# Patient Record
Sex: Male | Born: 1955
Health system: Southern US, Community
[De-identification: ages and names within clinical notes are randomized; demographics above are authoritative.]

## PROBLEM LIST (undated history)

## (undated) DIAGNOSIS — E1165 Type 2 diabetes mellitus with hyperglycemia: Secondary | ICD-10-CM

## (undated) DIAGNOSIS — M545 Low back pain, unspecified: Secondary | ICD-10-CM

## (undated) DIAGNOSIS — M1990 Unspecified osteoarthritis, unspecified site: Secondary | ICD-10-CM

## (undated) DIAGNOSIS — R234 Changes in skin texture: Secondary | ICD-10-CM

## (undated) DIAGNOSIS — G8929 Other chronic pain: Secondary | ICD-10-CM

## (undated) DIAGNOSIS — Z5189 Encounter for other specified aftercare: Secondary | ICD-10-CM

## (undated) DIAGNOSIS — I10 Essential (primary) hypertension: Secondary | ICD-10-CM

## (undated) DIAGNOSIS — E119 Type 2 diabetes mellitus without complications: Secondary | ICD-10-CM

## (undated) DIAGNOSIS — G4700 Insomnia, unspecified: Secondary | ICD-10-CM

## (undated) DIAGNOSIS — R252 Cramp and spasm: Secondary | ICD-10-CM

## (undated) HISTORY — PX: FINGER SURGERY: SHX640

## (undated) HISTORY — DX: Cramp and spasm: R25.2

## (undated) HISTORY — DX: Encounter for other specified aftercare: Z51.89

## (undated) HISTORY — DX: Other chronic pain: G89.29

## (undated) HISTORY — DX: Low back pain: M54.5

## (undated) HISTORY — DX: Low back pain, unspecified: M54.50

## (undated) HISTORY — DX: Type 2 diabetes mellitus with hyperglycemia: E11.65

## (undated) HISTORY — DX: Type 2 diabetes mellitus without complications: E11.9

## (undated) HISTORY — DX: Unspecified osteoarthritis, unspecified site: M19.90

## (undated) HISTORY — PX: CHOLECYSTECTOMY: SHX55

## (undated) HISTORY — DX: Changes in skin texture: R23.4

## (undated) HISTORY — DX: Essential (primary) hypertension: I10

## (undated) HISTORY — PX: TOE AMPUTATION: SHX809

## (undated) HISTORY — PX: EYE MUSCLE SURGERY: SHX370

## (undated) HISTORY — DX: Insomnia, unspecified: G47.00

---

## 2005-08-01 ENCOUNTER — Encounter (INDEPENDENT_AMBULATORY_CARE_PROVIDER_SITE_OTHER): Payer: Self-pay | Admitting: Specialist

## 2005-08-01 ENCOUNTER — Ambulatory Visit (HOSPITAL_COMMUNITY): Admission: RE | Admit: 2005-08-01 | Discharge: 2005-08-01 | Payer: Self-pay | Admitting: Urology

## 2005-08-23 ENCOUNTER — Emergency Department (HOSPITAL_COMMUNITY): Admission: EM | Admit: 2005-08-23 | Discharge: 2005-08-23 | Payer: Self-pay | Admitting: Emergency Medicine

## 2010-01-08 ENCOUNTER — Emergency Department (HOSPITAL_COMMUNITY): Admission: EM | Admit: 2010-01-08 | Discharge: 2010-01-08 | Payer: Self-pay | Admitting: Emergency Medicine

## 2010-06-11 ENCOUNTER — Other Ambulatory Visit: Payer: Self-pay | Admitting: Emergency Medicine

## 2010-06-11 ENCOUNTER — Ambulatory Visit
Admission: RE | Admit: 2010-06-11 | Discharge: 2010-06-11 | Disposition: A | Discharge status: Home / Self Care | Payer: BC Managed Care – PPO | Source: Ambulatory Visit | Attending: Emergency Medicine | Admitting: Emergency Medicine

## 2010-06-11 DIAGNOSIS — R52 Pain, unspecified: Secondary | ICD-10-CM

## 2010-08-09 NOTE — H&P (Signed)
NAMEFLOYD, [name]             ACCOUNT NO.:  [id number]   MEDICAL RECORD NO.:  [id number]          PATIENT TYPE:  AMB   LOCATION:  DAY                           FACILITY:  [facility]   PHYSICIAN:  Ky Barban, M.D.DATE OF BIRTH:  1955-07-11   DATE OF ADMISSION:  DATE OF DISCHARGE:  LH                                HISTORY & PHYSICAL   CHIEF COMPLAINT:  Phimosis.   A 55 year old gentleman referred to me by Dr. Myna Hidalgo, who is having  problems with his foreskin.  It is tight and he said this is based on when  he has erection and sexual intercourse and wants to get circumcised.  No  other medical problems like diabetes or hypertension.   PAST HISTORY:  He had surgery on his left foot three times and also had  surgery on his right hand.  No history of hypertension or diabetes.   FAMILY HISTORY:  No history of prostate cancer.   MEDICATIONS:  Metformin 500 mg p.o. b.i.d.   REVIEW OF SYSTEMS:  Unremarkable.   PHYSICAL EXAMINATION:  VITAL SIGNS:  Blood pressure 110/70, temperature  97.8.  CENTRAL NERVOUS SYSTEM:  Negative.  HEAD AND NECK:  Negative.  CHEST:  Symmetrical.  HEART:  Regular sinus rhythm.  No murmur.  ABDOMEN:  Soft, flat.  The liver, spleen and kidneys are nonpalpable.  GENITAL:  Circumcised.  Meatus is very good.  Testes are normal.  RECTAL:  Normal sphincter tone.  No rectal mass.  Prostate 1+ and firm.   IMPRESSION:  1.  Phimosis.  2.  Diabetes.  3.  Hypertension.   PLAN:  Circumcision under anesthesia as an outpatient.  The procedure,  limitations and complications were discussed.      Ky Barban, M.D.  Electronically Signed     MIJ/MEDQ  D:  07/31/2005  T:  07/31/2005  Job:  [id number]   cc:   Rose Phi. Myna Hidalgo, M.D.  Fax: [phone]

## 2010-08-09 NOTE — Op Note (Signed)
[name], [name]             ACCOUNT NO.:  [id number]   MEDICAL RECORD NO.:  [id number]          PATIENT TYPE:  AMB   LOCATION:  DAY                           FACILITY:  [facility]   PHYSICIAN:  Ky Barban, M.D.DATE OF BIRTH:  12/25/1955   DATE OF PROCEDURE:  08/01/2005  DATE OF DISCHARGE:                                 OPERATIVE REPORT   PREOPERATIVE DIAGNOSIS:  Phimosis.   POSTOPERATIVE DIAGNOSIS:  Phimosis.   OPERATION PERFORMED:  Circumcision.   SURGEON:  Ky Barban, M.D.   ANESTHESIA:  General endotracheal.   DESCRIPTION OF PROCEDURE:  Patient is placed in supine position under  general endotracheal anesthesia.  After usual prep and drape, dorsal slit is  made. Glans penis was prepped with Betadine, redundant prepuce  circumferentially excised leaving about 5 mm mucosa.  Bleeders were ligated  and coagulated.  Complete hemostasis was obtained.  Then the circumference  was closed with interrupted sutures of 4-0 chromic.  Sterile gauze dressing  applied.  The base of the penis infiltrated with 0.25% Marcaine.  Patient  left the operating room in satisfactory condition.      Ky Barban, M.D.  Electronically Signed     MIJ/MEDQ  D:  08/01/2005  T:  08/02/2005  Job:  [id number]

## 2010-12-18 ENCOUNTER — Other Ambulatory Visit: Payer: Self-pay | Admitting: Emergency Medicine

## 2010-12-18 ENCOUNTER — Ambulatory Visit
Admission: RE | Admit: 2010-12-18 | Discharge: 2010-12-18 | Disposition: A | Discharge status: Home / Self Care | Payer: BC Managed Care – PPO | Source: Ambulatory Visit | Attending: Emergency Medicine | Admitting: Emergency Medicine

## 2010-12-18 DIAGNOSIS — R52 Pain, unspecified: Secondary | ICD-10-CM

## 2010-12-18 DIAGNOSIS — R200 Anesthesia of skin: Secondary | ICD-10-CM

## 2012-01-23 ENCOUNTER — Ambulatory Visit (INDEPENDENT_AMBULATORY_CARE_PROVIDER_SITE_OTHER): Payer: BC Managed Care – PPO | Admitting: Gastroenterology

## 2012-01-23 ENCOUNTER — Encounter: Payer: Self-pay | Admitting: Gastroenterology

## 2012-01-23 VITALS — BP 104/60 | HR 80 | Ht 71.75 in | Wt 233.4 lb

## 2012-01-23 DIAGNOSIS — R197 Diarrhea, unspecified: Secondary | ICD-10-CM

## 2012-01-23 DIAGNOSIS — R12 Heartburn: Secondary | ICD-10-CM

## 2012-01-23 MED ORDER — CHOLESTYRAMINE 4 G PO PACK: 1.0000 | PACK | Freq: Every day | ORAL | Status: DC

## 2012-01-23 NOTE — Progress Notes (Signed)
HPI: This is a    very pleasant 56 year old man whom I am meeting for the first time today.  Had colonoscopy at [facility]  Going on for a few years, underwent colonoscopy at [facility];  Also EGD These were at [facility].  Whenever he eats he has burning pains in chest. He has nausea as well.  He does not vomit very often.    90% of meals he has pyrosis.  Without imodium he would have 5-6 loose stools a day.  No bleeding.  Usually very loose stools.    Had GB removed 6-7 years ago, shortly before the loose stool. Has never tried anything else for bowels except imodium  Takes advil periodically.  About 3-4 times a week.  Drinks 4 beers a day.    Has tried tums for cramps.   No caffeine.   Smoke 1ppd.  Stable weight.     Review of systems: Pertinent positive and negative review of systems were noted in the above HPI section. Complete review of systems was performed and was otherwise normal.    Past Medical History  Diagnosis Date  . Diabetes   . Skin fissure   . Hypertension   . Insomnia   . Muscle cramps     Past Surgical History  Procedure Date  . Toe amputation     left  . Cholecystectomy   . Finger surgery     right hand  . Eye muscle surgery     bilateral    Current Outpatient Prescriptions  Medication Sig Dispense Refill  . glyBURIDE-metformin (GLUCOVANCE) 2.5-500 MG per tablet Take 1 tablet by mouth 2 (two) times daily with a meal.      . lisinopril-hydrochlorothiazide (PRINZIDE,ZESTORETIC) 20-12.5 MG per tablet Take 1 tablet by mouth daily.      . Multiple Vitamins-Minerals (ONE-A-DAY MENS HEALTH FORMULA PO) Take 1 tablet by mouth daily.      . Omega-3 Fatty Acids (FISH OIL PO) Take 1 tablet by mouth daily.        Allergies as of 01/23/2012  . (No Known Allergies)    Family History  Problem Relation Age of Onset  . Diabetes Sister   . Hypertension Sister     History   Social History  . Marital Status: Married    Spouse Name: N/A    Number of  Children: 3  . Years of Education: N/A   Occupational History  . 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 Smoking status: Current Every Day Smoker    Types: Cigarettes  . Smokeless tobacco: Never Used  . Alcohol Use: Yes     2 24oz cans of beer twice a day  . Drug Use: No  . Sexually Active: Not on file   Other Topics Concern  . Not on file   Social History Narrative  . No narrative on file       Physical Exam: Ht 5' 11.75" (1.822 m)  Wt 233 lb 6 oz (105.858 kg)  BMI 31.87 kg/m2 Constitutional: generally well-appearing Psychiatric: alert and oriented x3 Eyes: extraocular movements intact Mouth: oral pharynx moist, no lesions Neck: supple no lymphadenopathy Cardiovascular: heart regular rate and rhythm Lungs: clear to auscultation bilaterally Abdomen: soft, nontender, nondistended, no obvious ascites, no peritoneal signs, normal bowel sounds Extremities: no lower extremity edema bilaterally Skin: no lesions on visible extremities    Assessment and plan: 56 y.o. male with  pyrosis, chronic loose stools  We are going to get  previous records from [facility] sent here for review. In the meantime he is going to start once daily omeprazole as well as once daily cholestyramine for likely diarrhea related to his gallbladder surgery many years ago. He will call to report on his symptoms, response to medicines in 3 or 4 weeks.

## 2012-01-23 NOTE — Patient Instructions (Addendum)
One of your biggest health concerns is your smoking.  This increases your risk for most cancers and serious cardiovascular diseases such as strokes, heart attacks.  You should try your best to stop.  If you need assistance, please contact your PCP or Smoking Cessation Class at [facility] [phone]) or [facility] Quit-Line (1-800-QUIT-NOW). We will get records sent from your previous gastroenterologist for review.  This will include any endoscopic (colonoscopy or upper endoscopy) procedures and any associated pathology reports (done at [facility] 6-7 years ago, EGD, colonoscopy). Start omeprazole (OTC) one pill once daily, 20-30 min before breakfast meal daily. Start new medicine, cholestyramine After reviewing your [facility] records, may need to repeat some testing. Call to report on your symptoms, response to omeprazole and cholestyramine powder in 4 weeks.

## 2012-01-27 ENCOUNTER — Telehealth: Payer: Self-pay | Admitting: Gastroenterology

## 2012-01-27 NOTE — Telephone Encounter (Signed)
See the below phone note, I closed it before sending it.

## 2012-01-27 NOTE — Telephone Encounter (Signed)
EGD 2/ 2006 at [facility], [place] by Dr. Linna Darner; for abd pain, status post cholecystectomyh; findingins: "long hiatal hernia, non-oabstructive stricture...areas of acute gastritis, ulcer in the prepyloric area" biopsies taken for clo.  Unkown results of biopsies  Colonoscopy 01/2000 at [facility], [place], Dr. Linna Darner; done for Ct scan suggsting possible crohn's; findings "diverticulosis in left colon, mucosal erythema with  Small erosions in cecal area, unable to get into TI. Biopseis taken from cecum: "mild chronic colitis with interstitial congestion"  Patty, Let him know I reviewed his [facility] records.  He will need colonsocopy for screening and chronic diarrhea and EGD for GERD, history of ulcer in stomach.  LEC, moderate sedation, thanks

## 2012-01-28 NOTE — Telephone Encounter (Signed)
Unable to reach pt phone has been disconnected and letter has been mailed

## 2013-01-31 ENCOUNTER — Encounter (HOSPITAL_COMMUNITY): Payer: Self-pay | Admitting: Emergency Medicine

## 2013-01-31 ENCOUNTER — Emergency Department (HOSPITAL_COMMUNITY)
Admission: EM | Admit: 2013-01-31 | Discharge: 2013-01-31 | Disposition: A | Discharge status: Home / Self Care | Payer: Commercial Managed Care - PPO | Source: Home / Self Care | Attending: Emergency Medicine | Admitting: Emergency Medicine

## 2013-01-31 DIAGNOSIS — K612 Anorectal abscess: Secondary | ICD-10-CM

## 2013-01-31 DIAGNOSIS — K611 Rectal abscess: Secondary | ICD-10-CM

## 2013-01-31 MED ORDER — CIPROFLOXACIN HCL 500 MG PO TABS: 500.0000 mg | ORAL_TABLET | Freq: Two times a day (BID) | ORAL | Status: DC

## 2013-01-31 MED ORDER — METRONIDAZOLE 500 MG PO TABS: 500.0000 mg | ORAL_TABLET | Freq: Three times a day (TID) | ORAL | Status: DC

## 2013-01-31 MED ORDER — HYDROCODONE-ACETAMINOPHEN 5-325 MG PO TABS
2.0000 | ORAL_TABLET | Freq: Once | ORAL | Status: AC
Start: 2013-01-31 — End: 2013-01-31
  Administered 2013-01-31: 2 via ORAL

## 2013-01-31 MED ORDER — HYDROCODONE-ACETAMINOPHEN 5-325 MG PO TABS: ORAL_TABLET | ORAL | Status: DC

## 2013-01-31 MED ORDER — HYDROCODONE-ACETAMINOPHEN 5-325 MG PO TABS
ORAL_TABLET | ORAL | Status: AC
  Filled 2013-01-31: qty 2

## 2013-01-31 NOTE — ED Provider Notes (Signed)
Chief Complaint:   Chief Complaint  Patient presents with  . Tailbone Pain    History of Present Illness:    [name] is a 57 year old diabetic male who has had a four-day history of a painful bump in his rectal area just at the midline. This is draining some blood and pus. He denies any fever or chills. He's never had anything like this before. Denies diarrhea, constipation, blood in the stool, or abdominal pain.  Review of Systems:  Other than noted above, the patient denies any of the following symptoms: Systemic:  No fever, chills or sweats. Skin:  No rash or itching.  PMFSH:  Past medical history, family history, social history, meds, and allergies were reviewed.  No prior history of abscesses or MRSA. He has diabetes and hypertension. He takes Questran, Glucovance, lisinopril/hydrochlorothiazide, and omega-3 fatty acids.  Physical Exam:   Vital signs:  BP 160/93  Pulse 80  Temp(Src) 98.3 F (36.8 C) (Oral)  Resp 16  SpO2 98% Skin:  In the perianal area there is a tender, raised, fluctuant nodule just posterior to the anus. This appears to have drained a large amount of pus previously.  Skin exam was otherwise normal.  No rash. Ext:  Distal pulses were full, patient has full ROM of all joints.  Procedure:  Verbal informed consent was obtained.  The patient was informed of the risks and benefits of the procedure and understands and accepts.  Identity of the patient was verified verbally and by wristband.   The abscess area described above was prepped with Betadine and alcohol and anesthetized with 2 mL of 2% Xylocaine with epinephrine.  Using a #11 scalpel blade, a singe straight incision was made into the area of fluctulence, yielding no prurulent drainage.  Routine cultures were obtained.  Blunt dissection was used to break up loculations a gauze dressing was left in place, the patient may remove this and start warm sitz baths this afternoon.  A sterile pressure dressing was  applied.  Assessment:  The encounter diagnosis was Peri-rectal abscess.  My incision and drainage did not return any pus. I'm thinking that his story drained itself. He needs antibiotics and hot sitz baths. I did not obtain a culture, since there was nothing to culture.  Plan:   1.  Meds:  The following meds were prescribed:   New Prescriptions   CIPROFLOXACIN (CIPRO) 500 MG TABLET    Take 1 tablet (500 mg total) by mouth every 12 (twelve) hours.   HYDROCODONE-ACETAMINOPHEN (NORCO/VICODIN) 5-325 MG PER TABLET    1 to 2 tabs every 4 to 6 hours as needed for pain.   METRONIDAZOLE (FLAGYL) 500 MG TABLET    Take 1 tablet (500 mg total) by mouth 3 (three) times daily.    2.  Patient Education/Counseling:  The patient was given appropriate handouts, self care instructions, and instructed in symptomatic relief.  Suggested hot sitz baths twice a day.  3.  Follow up:  The patient was instructed to leave the dressing in place and return again in 48 hours for wound recheck, if becoming worse in any way, and given some red flag symptoms such as fever or worsening pain which would prompt immediate return.  Follow up here in 48 hours.     Reuben Likes, MD 01/31/13 [phone]

## 2013-01-31 NOTE — ED Notes (Signed)
C/o pain anal area ; declined this writers eval of area

## 2013-03-04 ENCOUNTER — Emergency Department (HOSPITAL_COMMUNITY)
Admission: EM | Admit: 2013-03-04 | Discharge: 2013-03-04 | Disposition: A | Discharge status: Home / Self Care | Payer: Commercial Managed Care - PPO | Source: Home / Self Care

## 2013-03-06 ENCOUNTER — Encounter (HOSPITAL_COMMUNITY): Payer: Self-pay | Admitting: Emergency Medicine

## 2013-03-06 ENCOUNTER — Emergency Department (INDEPENDENT_AMBULATORY_CARE_PROVIDER_SITE_OTHER)
Admission: EM | Admit: 2013-03-06 | Discharge: 2013-03-06 | Disposition: A | Discharge status: Home / Self Care | Payer: Commercial Managed Care - PPO | Source: Home / Self Care | Attending: Emergency Medicine | Admitting: Emergency Medicine

## 2013-03-06 DIAGNOSIS — K612 Anorectal abscess: Secondary | ICD-10-CM

## 2013-03-06 DIAGNOSIS — K610 Anal abscess: Secondary | ICD-10-CM

## 2013-03-06 MED ORDER — METRONIDAZOLE 500 MG PO TABS: 500.0000 mg | ORAL_TABLET | Freq: Three times a day (TID) | ORAL | Status: DC

## 2013-03-06 MED ORDER — CIPROFLOXACIN HCL 500 MG PO TABS: 500.0000 mg | ORAL_TABLET | Freq: Two times a day (BID) | ORAL | Status: DC

## 2013-03-06 NOTE — ED Notes (Signed)
C/o boil on buttocks States he was here three weeks ago for boil States he soaked in epsom salt States he has pain and drainage which is yellow and green Did take antibiotics

## 2013-03-06 NOTE — ED Provider Notes (Signed)
CSN: [id number]     Arrival date & time 03/06/13  6213 History   First MD Initiated Contact with Patient 03/06/13 1017     Chief Complaint  Patient presents with  . Recurrent Skin Infections   (Consider location/radiation/quality/duration/timing/severity/associated sxs/prior Treatment) HPI Comments: Pt seen here 11/10 for the same complaint. Area has continued to drain pus despite tx with cipro and flagyl.   Patient is a 57 y.o. male presenting with abscess. The history is provided by the patient.  Abscess Location:  Ano-genital Ano-genital abscess location:  Anus Abscess quality: draining   Duration:  5 weeks Progression:  Unchanged Chronicity:  Recurrent Context: diabetes   Relieved by:  Nothing Worsened by:  Nothing tried Ineffective treatments:  Oral antibiotics Associated symptoms: no fever     Past Medical History  Diagnosis Date  . Diabetes   . Skin fissure   . Hypertension   . Insomnia   . Muscle cramps    Past Surgical History  Procedure Laterality Date  . Toe amputation      left  . Cholecystectomy    . Finger surgery      right hand  . Eye muscle surgery      bilateral   Family History  Problem Relation Age of Onset  . Diabetes Sister   . Hypertension Sister    History  Substance Use Topics  . Smoking status: Current Every Day Smoker    Types: Cigarettes  . Smokeless tobacco: Never Used  . Alcohol Use: Yes     Comment: 2 24oz cans of beer twice a day    Review of Systems  Constitutional: Negative for fever and chills.  Skin:       Abscess in perianal region    Allergies  Review of patient's allergies indicates no known allergies.  Home Medications   Current Outpatient Rx  Name  Route  Sig  Dispense  Refill  . cholestyramine (QUESTRAN) 4 G packet   Oral   Take 1 packet by mouth daily.   60 each   12   . ciprofloxacin (CIPRO) 500 MG tablet   Oral   Take 1 tablet (500 mg total) by mouth every 12 (twelve) hours.   14 tablet   0   . glyBURIDE-metformin (GLUCOVANCE) 2.5-500 MG per tablet   Oral   Take 1 tablet by mouth 2 (two) times daily with a meal.         . HYDROcodone-acetaminophen (NORCO/VICODIN) 5-325 MG per tablet      1 to 2 tabs every 4 to 6 hours as needed for pain.   20 tablet   0   . lisinopril-hydrochlorothiazide (PRINZIDE,ZESTORETIC) 20-12.5 MG per tablet   Oral   Take 1 tablet by mouth daily.         . metroNIDAZOLE (FLAGYL) 500 MG tablet   Oral   Take 1 tablet (500 mg total) by mouth 3 (three) times daily.   21 tablet   0   . Multiple Vitamins-Minerals (ONE-A-DAY MENS HEALTH FORMULA PO)   Oral   Take 1 tablet by mouth daily.         . Omega-3 Fatty Acids (FISH OIL PO)   Oral   Take 1 tablet by mouth daily.          BP 152/76  Pulse 67  Temp(Src) 98.7 F (37.1 C) (Oral)  Resp 17  SpO2 100% Physical Exam  Constitutional: He appears well-developed and well-nourished. No distress.  Genitourinary:  ED Course  Procedures (including critical care time) Labs Review Labs Reviewed - No data to display Imaging Review No results found.  EKG Interpretation    Date/Time:    Ventricular Rate:    PR Interval:    QRS Duration:   QT Interval:    QTC Calculation:   R Axis:     Text Interpretation:              MDM   1. Perianal abscess   Dr. Lorenz Coaster saw pt with me, as he cared for pt 11/10. Abscess is in same area as previous. Most likely more internal abscess with fistula. Referred to general surgery. Rx cipro 500mg  BID #14 and flagyl 500mg  TID #21.      Cathlyn Parsons, NP 03/06/13 1031

## 2013-03-06 NOTE — ED Provider Notes (Signed)
Medical screening examination/treatment/procedure(s) were performed by non-physician practitioner and as supervising physician I was immediately available for consultation/collaboration.  Tahtiana Rozier, M.D.  Yoko Mcgahee C Kaileia Flow, MD 03/06/13 2057 

## 2013-03-07 ENCOUNTER — Encounter (INDEPENDENT_AMBULATORY_CARE_PROVIDER_SITE_OTHER): Payer: Self-pay | Admitting: Surgery

## 2013-03-07 ENCOUNTER — Telehealth (INDEPENDENT_AMBULATORY_CARE_PROVIDER_SITE_OTHER): Payer: Self-pay | Admitting: Surgery

## 2013-03-07 ENCOUNTER — Ambulatory Visit (INDEPENDENT_AMBULATORY_CARE_PROVIDER_SITE_OTHER): Payer: Commercial Managed Care - PPO | Admitting: Surgery

## 2013-03-07 ENCOUNTER — Encounter (INDEPENDENT_AMBULATORY_CARE_PROVIDER_SITE_OTHER): Payer: Self-pay

## 2013-03-07 VITALS — BP 132/78 | HR 82 | Temp 98.0°F | Resp 18 | Ht 74.0 in | Wt 226.0 lb

## 2013-03-07 DIAGNOSIS — K603 Anal fistula, unspecified: Secondary | ICD-10-CM

## 2013-03-07 NOTE — Telephone Encounter (Signed)
Patient met with surgery scheduling, went over financial responsibilities, will call back to schedule. °

## 2013-03-07 NOTE — Progress Notes (Signed)
Patient ID: [name], male   DOB: [birth year], 57 y.o.   MRN: [id number]  Chief Complaint  Patient presents with  . Follow-up    peri. rectal abcs.    HPI [name] is a 57 y.o. male.   HPIThis is a 57 year old gentleman sent from the emergency department for evaluation of a possible perianal fistula. He started having perianal drainage over a month ago. He has occasional mild discomfort. He describes his drainage was purulent. He has no issues regarding continence. He has had no previous anal surgery. He denies any previous anal fissure. He is otherwise without complaints  Past Medical History  Diagnosis Date  . Diabetes   . Skin fissure   . Hypertension   . Insomnia   . Muscle cramps     Past Surgical History  Procedure Laterality Date  . Toe amputation      left  . Cholecystectomy    . Finger surgery      right hand  . Eye muscle surgery      bilateral    Family History  Problem Relation Age of Onset  . Diabetes Sister   . Hypertension Sister     Social History History  Substance Use Topics  . Smoking status: Current Every Day Smoker    Types: Cigarettes  . Smokeless tobacco: Never Used  . Alcohol Use: Yes     Comment: 2 24oz cans of beer twice a day    No Known Allergies  Current Outpatient Prescriptions  Medication Sig Dispense Refill  . ciprofloxacin (CIPRO) 500 MG tablet Take 1 tablet (500 mg total) by mouth every 12 (twelve) hours.  14 tablet  0  . glyBURIDE-metformin (GLUCOVANCE) 2.5-500 MG per tablet Take 1 tablet by mouth 2 (two) times daily with a meal.      . lisinopril-hydrochlorothiazide (PRINZIDE,ZESTORETIC) 20-12.5 MG per tablet Take 1 tablet by mouth daily.      . metroNIDAZOLE (FLAGYL) 500 MG tablet Take 1 tablet (500 mg total) by mouth 3 (three) times daily.  21 tablet  0  . Multiple Vitamins-Minerals (ONE-A-DAY MENS HEALTH FORMULA PO) Take 1 tablet by mouth daily.      . Omega-3 Fatty Acids (FISH OIL PO) Take 1 tablet by mouth  daily.      . cholestyramine (QUESTRAN) 4 G packet Take 1 packet by mouth daily.  60 each  12  . HYDROcodone-acetaminophen (NORCO/VICODIN) 5-325 MG per tablet 1 to 2 tabs every 4 to 6 hours as needed for pain.  20 tablet  0   No current facility-administered medications for this visit.    Review of Systems Review of Systems  Constitutional: Negative for fever, chills and unexpected weight change.  HENT: Negative for congestion, hearing loss, sore throat, trouble swallowing and voice change.   Eyes: Negative for visual disturbance.  Respiratory: Negative for cough and wheezing.   Cardiovascular: Negative for chest pain, palpitations and leg swelling.  Gastrointestinal: Positive for rectal pain. Negative for nausea, vomiting, abdominal pain, diarrhea, constipation, blood in stool, abdominal distention and anal bleeding.  Genitourinary: Negative for hematuria and difficulty urinating.  Musculoskeletal: Negative for arthralgias.  Skin: Negative for rash and wound.  Neurological: Negative for seizures, syncope, weakness and headaches.  Hematological: Negative for adenopathy. Does not bruise/bleed easily.  Psychiatric/Behavioral: Negative for confusion.    Blood pressure 132/78, pulse 82, temperature 98 F (36.7 C), resp. rate 18, height 6\' 2"  (1.88 m), weight 226 lb (102.513 kg).  Physical Exam Physical Exam  Constitutional: He is oriented to person, place, and time. He appears well-nourished. No distress.  HENT:  Head: Normocephalic and atraumatic.  Right Ear: External ear normal.  Left Ear: External ear normal.  Nose: Nose normal.  Eyes: Conjunctivae are normal. Pupils are equal, round, and reactive to light.  Neck: Normal range of motion. Neck supple.  Cardiovascular: Normal rate, regular rhythm and normal heart sounds.   Pulmonary/Chest: Effort normal and breath sounds normal. No respiratory distress.  Genitourinary: Rectum normal.  There is a small opening just to the right of  the posterior midline in the perineum near the anus. There is no tenderness and only clear fluid draining. There is an old skin fold consistent with a possible previous fissure in the posterior midline  Neurological: He is alert and oriented to person, place, and time.  Skin: He is not diaphoretic.  Psychiatric: His behavior is normal.    Data Reviewed   Assessment    Perianal fistula     Plan    I explained the diagnosis to the patient and his wife. I would recommend fistulotomy under general anesthesia. The risks which include but are not limited to bleeding, infection, issues incontinence, injury to surround structures, having chronic drainage, recurrence, etc. He understands and wishes to proceed. Surgery will be scheduled        Toye Rouillard A 03/07/2013, 3:17 PM

## 2013-09-06 ENCOUNTER — Other Ambulatory Visit: Payer: Self-pay | Admitting: Otolaryngology

## 2013-09-06 DIAGNOSIS — H7190 Unspecified cholesteatoma, unspecified ear: Secondary | ICD-10-CM

## 2013-09-08 ENCOUNTER — Ambulatory Visit
Admission: RE | Admit: 2013-09-08 | Discharge: 2013-09-08 | Disposition: A | Discharge status: Home / Self Care | Payer: Commercial Managed Care - PPO | Source: Ambulatory Visit | Attending: Otolaryngology | Admitting: Otolaryngology

## 2013-09-08 DIAGNOSIS — H7190 Unspecified cholesteatoma, unspecified ear: Secondary | ICD-10-CM

## 2013-09-12 ENCOUNTER — Other Ambulatory Visit: Payer: Self-pay | Admitting: Otolaryngology

## 2013-10-07 ENCOUNTER — Other Ambulatory Visit: Payer: Self-pay | Admitting: Otolaryngology

## 2014-07-13 LAB — HM DIABETES EYE EXAM

## 2015-01-24 ENCOUNTER — Ambulatory Visit: Payer: Self-pay | Admitting: Family Medicine

## 2015-01-24 ENCOUNTER — Ambulatory Visit (INDEPENDENT_AMBULATORY_CARE_PROVIDER_SITE_OTHER): Payer: Managed Care, Other (non HMO) | Admitting: Family Medicine

## 2015-01-24 ENCOUNTER — Encounter: Payer: Self-pay | Admitting: Family Medicine

## 2015-01-24 VITALS — BP 118/76 | HR 68 | Ht 73.25 in | Wt 219.6 lb

## 2015-01-24 DIAGNOSIS — Z7689 Persons encountering health services in other specified circumstances: Secondary | ICD-10-CM

## 2015-01-24 DIAGNOSIS — Z720 Tobacco use: Secondary | ICD-10-CM | POA: Diagnosis not present

## 2015-01-24 DIAGNOSIS — J029 Acute pharyngitis, unspecified: Secondary | ICD-10-CM | POA: Diagnosis not present

## 2015-01-24 DIAGNOSIS — F1010 Alcohol abuse, uncomplicated: Secondary | ICD-10-CM | POA: Insufficient documentation

## 2015-01-24 DIAGNOSIS — E1165 Type 2 diabetes mellitus with hyperglycemia: Secondary | ICD-10-CM | POA: Diagnosis not present

## 2015-01-24 DIAGNOSIS — IMO0001 Reserved for inherently not codable concepts without codable children: Secondary | ICD-10-CM

## 2015-01-24 DIAGNOSIS — Z7189 Other specified counseling: Secondary | ICD-10-CM

## 2015-01-24 DIAGNOSIS — F17200 Nicotine dependence, unspecified, uncomplicated: Secondary | ICD-10-CM | POA: Insufficient documentation

## 2015-01-24 DIAGNOSIS — I10 Essential (primary) hypertension: Secondary | ICD-10-CM | POA: Diagnosis not present

## 2015-01-24 HISTORY — DX: Reserved for inherently not codable concepts without codable children: IMO0001

## 2015-01-24 LAB — POCT GLYCOSYLATED HEMOGLOBIN (HGB A1C): Hemoglobin A1C: 8.2

## 2015-01-24 MED ORDER — LISINOPRIL-HYDROCHLOROTHIAZIDE 10-12.5 MG PO TABS: 1.0000 | ORAL_TABLET | Freq: Every day | ORAL | Status: DC

## 2015-01-24 MED ORDER — METFORMIN HCL ER (MOD) 1000 MG PO TB24: 1000.0000 mg | ORAL_TABLET | Freq: Two times a day (BID) | ORAL | Status: DC

## 2015-01-24 MED ORDER — METFORMIN HCL 1000 MG PO TABS: 1000.0000 mg | ORAL_TABLET | Freq: Two times a day (BID) | ORAL | Status: DC

## 2015-01-24 NOTE — Progress Notes (Signed)
Subjective:    Patient ID: [name], male    DOB: 01-22-1956, 59 y.o.   MRN: [id number]  HPI Chief Complaint  Patient presents with  . new pt    new pt get established. would like a refill on meds- lisinopril-hctz, metformin. would like 3 month supply. sore throat 2-3 days   He is here to establish primary care. Most recent medical care was received at his previous employer [employer]. He states he had labs in May before he left and a physical exam. He has 2 medical conditions he would like addressed today and an acute complaint of sore throat for past 2 days. He denies fever, chills, headache, ear ache, congestion, sinus pain, drainage or cough. He has not taken anything for his sore throat. He does smoke. Was out of town last week and states he was exposed to a lot of people and does not know if anyone was sick. His wife is present for the visit.   HTN: states he was diagnosed more than 15 years ago and that he does not check his blood pressure at home.  Diabetes: states he was diagnosed about 15 years ago. States he does not check blood sugars at home. Report being compliant with twice daily Metformin.  Reports last physical exam: in May and everything normal. He does not know what his Hgb A1C was at that time.  Feet: 2 amputated toes from injury. Otherwise fine per patient.   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  He is not exercising. His diet is non-discretionary.  He reports he is drinking 2- 24 ounce beers daily and smoking 1 ppd. Not ready to stop drinking or smoking  Reviewed allergies, medications, past medical, surgical, family and social history. Immunizations - no records available Health maintenance: states he saw an eye doctor in May and told his eyes were good. Colonoscopy- states up to date  Review of Systems Pertinent positives and negatives in the history of present illness.    Objective:   Physical  Exam  Constitutional: He is oriented to person, place, and time. He appears well-developed and well-nourished. No distress.  HENT:  Right Ear: Tympanic membrane and ear canal normal.  Left Ear: Tympanic membrane and ear canal normal.  Nose: Nose normal.  Mouth/Throat: Uvula is midline and mucous membranes are normal. Posterior oropharyngeal erythema present. No oropharyngeal exudate or posterior oropharyngeal edema.  Lymphadenopathy:    He has no cervical adenopathy.       Right: No supraclavicular adenopathy present.       Left: No supraclavicular adenopathy present.  Neurological: He is alert and oriented to person, place, and time.  Psychiatric: He has a normal mood and affect. His speech is normal. Judgment and thought content normal. He is agitated.    Hemoglobin A1c 8.2    Assessment & Plan:  Uncontrolled type 2 diabetes mellitus without complication, without long-term current use of insulin (HCC) - Plan: DISCONTINUED: metFORMIN (GLUMETZA) 1000 MG (MOD) 24 hr tablet  Essential hypertension - Plan: lisinopril-hydrochlorothiazide (PRINZIDE,ZESTORETIC) 10-12.5 MG tablet, DISCONTINUED: lisinopril-hydrochlorothiazide (PRINZIDE,ZESTORETIC) 10-12.5 MG tablet  Acute pharyngitis, unspecified etiology  Encounter to establish care  Discussed that his sore throat is most likely due to a viral etiology and recommend symptomatic treatment. He will let me know if no improvement in 3-4 days.  Sabrina, CMA, called his pharmacy to confirm medications and they stated that he had not picked up medications  since February 2016. When patient was confronted about this he seems agitated and insisted that he had been taking medications.  Diabetes- discussed that records from the pharmacy shows that he is supposed to be taking Metformin and Glipezide or Glyburide. He denies ever taking these and states he does not want this medication refilled. Metformin refilled today. Discussed that his A1C is not in normal  range and that I recommend he start back on the Glipezide or Glyburide. He refuses to do this. I suggested that he start checking his blood sugars in the morning before breakfast at least two times per week and then 2 hours after a meal at least two times per week so we can get an idea of what his blood sugars are running at home. I then would like to bring him back and talk about the possibility of adding a second medication. Also discussed being aware of carbohydrates in his diet and starting to be more physically active.  Discussed reducing the amount of alcohol he is drinking and that this is not helping his diabetes or blood sugar. He seemed agitated and borderline inappropriate and did not want to discuss this.  HTN: refilled Lisinopril-HCTZ and no changes made. Blood pressure normal today.  Spent a minimum of 45 minutes face to face with patient and 50% of this was in counseling and coordination of care.  Need his records from [facility]

## 2015-01-24 NOTE — Patient Instructions (Addendum)
Cut back on alcohol intake. Check your blood sugars in morning before breakfast 2 times per week and 2 hours after a meal two times per week.  Use salt water gargles and throat lozenges for sore throat and let me know if it is not any better in a week.   Basic Carbohydrate Counting for Diabetes Mellitus Carbohydrate counting is a method for keeping track of the amount of carbohydrates you eat. Eating carbohydrates naturally increases the level of sugar (glucose) in your blood, so it is important for you to know the amount that is okay for you to have in every meal. Carbohydrate counting helps keep the level of glucose in your blood within normal limits. The amount of carbohydrates allowed is different for every person. A dietitian can help you calculate the amount that is right for you. Once you know the amount of carbohydrates you can have, you can count the carbohydrates in the foods you want to eat. Carbohydrates are found in the following foods:  Grains, such as breads and cereals.  Dried beans and soy products.  Starchy vegetables, such as potatoes, peas, and corn.  Fruit and fruit juices.  Milk and yogurt.  Sweets and snack foods, such as cake, cookies, candy, chips, soft drinks, and fruit drinks. CARBOHYDRATE COUNTING There are two ways to count the carbohydrates in your food. You can use either of the methods or a combination of both. Reading the "Nutrition Facts" on [place] The "Nutrition Facts" is an area that is included on the labels of almost all packaged food and beverages in the [place]. It includes the serving size of that food or beverage and information about the nutrients in each serving of the food, including the grams (g) of carbohydrate per serving.  Decide the number of servings of this food or beverage that you will be able to eat or drink. Multiply that number of servings by the number of grams of carbohydrate that is listed on the label for that serving.  The total will be the amount of carbohydrates you will be having when you eat or drink this food or beverage. Learning Standard Serving Sizes of Food When you eat food that is not packaged or does not include "Nutrition Facts" on the label, you need to measure the servings in order to count the amount of carbohydrates.A serving of most carbohydrate-rich foods contains about 15 g of carbohydrates. The following list includes serving sizes of carbohydrate-rich foods that provide 15 g ofcarbohydrate per serving:   1 slice of bread (1 oz) or 1 six-inch tortilla.    of a hamburger bun or English muffin.  4-6 crackers.   cup unsweetened dry cereal.    cup hot cereal.   cup rice or pasta.    cup mashed potatoes or  of a large baked potato.  1 cup fresh fruit or one small piece of fruit.    cup canned or frozen fruit or fruit juice.  1 cup milk.   cup plain fat-free yogurt or yogurt sweetened with artificial sweeteners.   cup cooked dried beans or starchy vegetable, such as peas, corn, or potatoes.  Decide the number of standard-size servings that you will eat. Multiply that number of servings by 15 (the grams of carbohydrates in that serving). For example, if you eat 2 cups of strawberries, you will have eaten 2 servings and 30 g of carbohydrates (2 servings x 15 g = 30 g). For foods such as soups and casseroles, in  which more than one food is mixed in, you will need to count the carbohydrates in each food that is included. EXAMPLE OF CARBOHYDRATE COUNTING Sample Dinner  3 oz chicken breast.   cup of brown rice.   cup of corn.  1 cup milk.   1 cup strawberries with sugar-free whipped topping.  Carbohydrate Calculation Step 1: Identify the foods that contain carbohydrates:   Rice.   Corn.   Milk.   Strawberries. Step 2:Calculate the number of servings eaten of each:   2 servings of rice.   1 serving of corn.   1 serving of milk.   1 serving  of strawberries. Step 3: Multiply each of those number of servings by 15 g:   2 servings of rice x 15 g = 30 g.   1 serving of corn x 15 g = 15 g.   1 serving of milk x 15 g = 15 g.   1 serving of strawberries x 15 g = 15 g. Step 4: Add together all of the amounts to find the total grams of carbohydrates eaten: 30 g + 15 g + 15 g + 15 g = 75 g.   This information is not intended to replace advice given to you by your health care provider. Make sure you discuss any questions you have with your health care provider.   Document Released: 03/10/2005 Document Revised: 03/31/2014 Document Reviewed: 02/04/2013 Elsevier Interactive Patient Education [employer].

## 2015-02-26 ENCOUNTER — Encounter: Payer: Self-pay | Admitting: Family Medicine

## 2015-02-26 ENCOUNTER — Ambulatory Visit (INDEPENDENT_AMBULATORY_CARE_PROVIDER_SITE_OTHER): Payer: Managed Care, Other (non HMO) | Admitting: Family Medicine

## 2015-02-26 VITALS — BP 120/72 | HR 64 | Temp 98.1°F | Wt 219.4 lb

## 2015-02-26 DIAGNOSIS — F17200 Nicotine dependence, unspecified, uncomplicated: Secondary | ICD-10-CM

## 2015-02-26 DIAGNOSIS — Z720 Tobacco use: Secondary | ICD-10-CM

## 2015-02-26 DIAGNOSIS — E1165 Type 2 diabetes mellitus with hyperglycemia: Secondary | ICD-10-CM

## 2015-02-26 DIAGNOSIS — J020 Streptococcal pharyngitis: Secondary | ICD-10-CM | POA: Diagnosis not present

## 2015-02-26 DIAGNOSIS — R5383 Other fatigue: Secondary | ICD-10-CM

## 2015-02-26 DIAGNOSIS — R509 Fever, unspecified: Secondary | ICD-10-CM

## 2015-02-26 DIAGNOSIS — F1010 Alcohol abuse, uncomplicated: Secondary | ICD-10-CM

## 2015-02-26 DIAGNOSIS — IMO0001 Reserved for inherently not codable concepts without codable children: Secondary | ICD-10-CM

## 2015-02-26 LAB — COMPREHENSIVE METABOLIC PANEL
ALBUMIN: 3.9 g/dL (ref 3.6–5.1)
ALK PHOS: 54 U/L (ref 40–115)
ALT: 45 U/L (ref 9–46)
AST: 38 U/L — ABNORMAL HIGH (ref 10–35)
BUN: 7 mg/dL (ref 7–25)
CHLORIDE: 98 mmol/L (ref 98–110)
CO2: 28 mmol/L (ref 20–31)
CREATININE: 0.9 mg/dL (ref 0.70–1.33)
Calcium: 8.9 mg/dL (ref 8.6–10.3)
Glucose, Bld: 192 mg/dL — ABNORMAL HIGH (ref 65–99)
Potassium: 4.5 mmol/L (ref 3.5–5.3)
Sodium: 136 mmol/L (ref 135–146)
TOTAL PROTEIN: 6.7 g/dL (ref 6.1–8.1)
Total Bilirubin: 0.3 mg/dL (ref 0.2–1.2)

## 2015-02-26 LAB — CBC WITH DIFFERENTIAL/PLATELET
BASOS ABS: 0 10*3/uL (ref 0.0–0.1)
Basophils Relative: 0 % (ref 0–1)
Eosinophils Absolute: 0.1 10*3/uL (ref 0.0–0.7)
Eosinophils Relative: 2 % (ref 0–5)
HCT: 43.2 % (ref 39.0–52.0)
HEMOGLOBIN: 14.3 g/dL (ref 13.0–17.0)
LYMPHS ABS: 2 10*3/uL (ref 0.7–4.0)
LYMPHS PCT: 35 % (ref 12–46)
MCH: 29.5 pg (ref 26.0–34.0)
MCHC: 33.1 g/dL (ref 30.0–36.0)
MCV: 89.3 fL (ref 78.0–100.0)
MONOS PCT: 13 % — AB (ref 3–12)
MPV: 9.4 fL (ref 8.6–12.4)
Monocytes Absolute: 0.8 10*3/uL (ref 0.1–1.0)
NEUTROS ABS: 2.9 10*3/uL (ref 1.7–7.7)
NEUTROS PCT: 50 % (ref 43–77)
Platelets: 253 10*3/uL (ref 150–400)
RBC: 4.84 MIL/uL (ref 4.22–5.81)
RDW: 12.9 % (ref 11.5–15.5)
WBC: 5.8 10*3/uL (ref 4.0–10.5)

## 2015-02-26 LAB — POCT MONO (EPSTEIN BARR VIRUS): MONO, POC: NEGATIVE

## 2015-02-26 LAB — TSH: TSH: 0.607 u[IU]/mL (ref 0.350–4.500)

## 2015-02-26 LAB — POCT RAPID STREP A (OFFICE): RAPID STREP A SCREEN: POSITIVE — AB

## 2015-02-26 MED ORDER — PENICILLIN G BENZATHINE & PROC 1200000 UNIT/2ML IM SUSP
1.2000 10*6.[IU] | Freq: Once | INTRAMUSCULAR | Status: AC
  Administered 2015-02-26: 1.2 10*6.[IU] via INTRAMUSCULAR

## 2015-02-26 MED ORDER — GLYBURIDE 1.25 MG PO TABS: 1.2500 mg | ORAL_TABLET | Freq: Every day | ORAL | Status: DC

## 2015-02-26 NOTE — Patient Instructions (Addendum)
You received an antibiotic injection (Penicillin) today for strep throat.  Glyburide 1.25 prescription sent to your pharmacy. Take this daily in the mornings. Continue checking her blood sugars and let me know if you have any lows (less than 80) and if your blood sugars are not consistently lower than 150 in the mornings and less than 180 two hours after eating.  Return for your diabetes follow-up appointment as scheduled. Strep Throat Strep throat is a bacterial infection of the throat. Your health care provider may call the infection tonsillitis or pharyngitis, depending on whether there is swelling in the tonsils or at the back of the throat. Strep throat is most common during the cold months of the year in children who are 32-54 years of age, but it can happen during any season in people of any age. This infection is spread from person to person (contagious) through coughing, sneezing, or close contact. CAUSES Strep throat is caused by the bacteria called Streptococcus pyogenes. RISK FACTORS This condition is more likely to develop in:  People who spend time in crowded places where the infection can spread easily.  People who have close contact with someone who has strep throat. SYMPTOMS Symptoms of this condition include:  Fever or chills.   Redness, swelling, or pain in the tonsils or throat.  Pain or difficulty when swallowing.  White or yellow spots on the tonsils or throat.  Swollen, tender glands in the neck or under the jaw.  Red rash all over the body (rare). DIAGNOSIS This condition is diagnosed by performing a rapid strep test or by taking a swab of your throat (throat culture test). Results from a rapid strep test are usually ready in a few minutes, but throat culture test results are available after one or two days. TREATMENT This condition is treated with antibiotic medicine. HOME CARE INSTRUCTIONS Medicines  Take over-the-counter and prescription medicines only as  told by your health care provider.  Take your antibiotic as told by your health care provider. Do not stop taking the antibiotic even if you start to feel better.  Have family members who also have a sore throat or fever tested for strep throat. They may need antibiotics if they have the strep infection. Eating and Drinking  Do not share food, drinking cups, or personal items that could cause the infection to spread to other people.  If swallowing is difficult, try eating soft foods until your sore throat feels better.  Drink enough fluid to keep your urine clear or pale yellow. General Instructions  Gargle with a salt-water mixture 3-4 times per day or as needed. To make a salt-water mixture, completely dissolve -1 tsp of salt in 1 cup of warm water.  Make sure that all household members wash their hands well.  Get plenty of rest.  Stay home from school or work until you have been taking antibiotics for 24 hours.  Keep all follow-up visits as told by your health care provider. This is important. SEEK MEDICAL CARE IF:  The glands in your neck continue to get bigger.  You develop a rash, cough, or earache.  You cough up a thick liquid that is green, yellow-brown, or bloody.  You have pain or discomfort that does not get better with medicine.  Your problems seem to be getting worse rather than better.  You have a fever. SEEK IMMEDIATE MEDICAL CARE IF:  You have new symptoms, such as vomiting, severe headache, stiff or painful neck, chest pain, or shortness  of breath.  You have severe throat pain, drooling, or changes in your voice.  You have swelling of the neck, or the skin on the neck becomes red and tender.  You have signs of dehydration, such as fatigue, dry mouth, and decreased urination.  You become increasingly sleepy, or you cannot wake up completely.  Your joints become red or painful.   This information is not intended to replace advice given to you by your  health care provider. Make sure you discuss any questions you have with your health care provider.   Document Released: 03/07/2000 Document Revised: 11/29/2014 Document Reviewed: 07/03/2014 Elsevier Interactive Patient Education [employer].

## 2015-02-26 NOTE — Addendum Note (Signed)
Addended by: Minette Headland A on: 02/26/2015 09:57 AM   Modules accepted: Orders

## 2015-02-26 NOTE — Progress Notes (Signed)
Subjective:    Patient ID: [name], male    DOB: [birth year], 57 y.o.   MRN: [id number]  HPI Chief Complaint  Patient presents with  . sore throat    sore throat, weakness- since last visit was here. white stuff in back of throat on friday. would like a black head looked at on back   He is here with complaints of sore throat for approximately one month that has gotten worse, states he . He also reports intermittent fever and sweating for past week. Body aches and decreased energy. Denies cough, drainage, congestion.  Blood sugar at home has been [phone] fasting and 140s-175-250s in evenings.  He is taking Metformin daily and Glyburide he takes if his blood sugar is greater than 200. He states this Glyburide 2.5mg  drops his sugar" too much" and he feels "wiped out".  He is requesting a lower dose of Glyburide. States he is having some numbness to the ball of his left foot, this is ongoing. He checks his feet daily and is careful about wearing shoes instead of going barefoot. Also reports some random, brief occasional episodes of "itching" to hand or foot.   He is still drinking beer daily, states he cut back to 1 beer. Smoking about a half a pack daily Started a new job and working days but switching to evenings.  States he has never been tested for HIV.   Reviewed allergies, medications, past medical and social history.  Review of Systems Pertinent positives and negatives in the history of present illness.    Objective:   Physical Exam BP 120/72 mmHg  Pulse 64  Temp(Src) 98.1 F (36.7 C) (Oral)  Wt 219 lb 6.4 oz (99.519 kg)  Alert and in no distress. No sinus tenderness Tympanic membranes with some scarring otherwise normal and canals are normal. Pharyngeal area is erythematous, with white patches, right side greater than left, no edema. Whitish buildup on tongue (patient reports unchanged for "years"). Neck is supple without adenopathy or thyromegaly. Cardiac exam shows a  regular sinus rhythm without murmurs or gallops. Lungs are clear to auscultation.   Rapid strep postive Mono spot negative    Assessment & Plan:  Acute streptococcal pharyngitis - Plan: POCT rapid strep A, penicillin g procaine-penicillin g benzathine (BICILLIN-CR) injection 600000-600000 units  Other fatigue - Plan: RPR, CBC with Differential/Platelet, Comprehensive metabolic panel, POCT Mono (Epstein Barr Virus), HIV antibody, TSH  Intermittent fever - Plan: RPR, CBC with Differential/Platelet, Comprehensive metabolic panel, POCT Mono (Epstein Barr Virus), HIV antibody  Uncontrolled type 2 diabetes mellitus without complication, without long-term current use of insulin ([place]) - Plan: glyBURIDE (DIABETA) 1.25 MG tablet  Smoker  Alcohol abuse, daily use  He was positive for strep pharyngitis, discussed options for treatment. Patient requested injection. Penicillin IM given. Patient observed in the office and no reaction.  Discussed consideration for thrush, however, patient states weight coating on his tongue has been present for years and is unchanged. We'll keep an eye on this. Discussed that prescription for glyburide 1.25 sent to pharmacy and that he should take this daily in the morning and continue checking his blood sugars. Reviewed his chart from last office visit and A1C was 8.2 at that time.  Also discussed that the fleeting episodes of itching to his hand or foot may be related to early development of peripheral neuropathy and that he will keep an eye on this and let me know if it gets worse.  He will let me know  if he has any lows or if he is continuing to have elevated blood sugars. Discussed that his fatigue may be related to strep and should resolve if this is the case. Recommend he stay well-hydrated and let me know if he is not feeling better. Will follow-up pending blood work. Recommend that he continue to cut back on his smoking and alcohol use, that this negatively affects  his blood sugars and overall health.

## 2015-02-27 LAB — RPR

## 2015-02-27 LAB — HIV ANTIBODY (ROUTINE TESTING W REFLEX): HIV: NONREACTIVE

## 2015-03-02 ENCOUNTER — Encounter: Payer: Self-pay | Admitting: Family Medicine

## 2015-03-30 ENCOUNTER — Encounter: Payer: Self-pay | Admitting: Family Medicine

## 2015-05-10 ENCOUNTER — Telehealth: Payer: Self-pay | Admitting: Family Medicine

## 2015-05-10 DIAGNOSIS — I10 Essential (primary) hypertension: Secondary | ICD-10-CM

## 2015-05-10 MED ORDER — LISINOPRIL-HYDROCHLOROTHIAZIDE 10-12.5 MG PO TABS: 1.0000 | ORAL_TABLET | Freq: Every day | ORAL | Status: DC

## 2015-05-10 NOTE — Telephone Encounter (Signed)
Pt come in and states that he needs a refill on his lisinopril pt uses [facility] [place], [place] - [address] and can be reached at [phone] (M) and please call and let him, know when it has been called in.

## 2015-05-10 NOTE — Telephone Encounter (Signed)
Sent med to pharmacy. And pt was notified

## 2015-05-21 ENCOUNTER — Encounter: Payer: Self-pay | Admitting: Family Medicine

## 2015-05-21 ENCOUNTER — Ambulatory Visit (INDEPENDENT_AMBULATORY_CARE_PROVIDER_SITE_OTHER): Payer: Managed Care, Other (non HMO) | Admitting: Family Medicine

## 2015-05-21 VITALS — BP 108/68 | HR 68 | Temp 98.9°F | Wt 208.2 lb

## 2015-05-21 DIAGNOSIS — E1165 Type 2 diabetes mellitus with hyperglycemia: Secondary | ICD-10-CM | POA: Diagnosis not present

## 2015-05-21 DIAGNOSIS — IMO0001 Reserved for inherently not codable concepts without codable children: Secondary | ICD-10-CM

## 2015-05-21 DIAGNOSIS — Z720 Tobacco use: Secondary | ICD-10-CM

## 2015-05-21 DIAGNOSIS — F1010 Alcohol abuse, uncomplicated: Secondary | ICD-10-CM

## 2015-05-21 DIAGNOSIS — F17200 Nicotine dependence, unspecified, uncomplicated: Secondary | ICD-10-CM

## 2015-05-21 DIAGNOSIS — J020 Streptococcal pharyngitis: Secondary | ICD-10-CM

## 2015-05-21 LAB — POCT RAPID STREP A (OFFICE): RAPID STREP A SCREEN: POSITIVE — AB

## 2015-05-21 MED ORDER — AMOXICILLIN 875 MG PO TABS: 875.0000 mg | ORAL_TABLET | Freq: Two times a day (BID) | ORAL | Status: DC

## 2015-05-21 NOTE — Progress Notes (Signed)
Subjective:  [name] is a 60 y.o. male who presents for evaluation of 6 day history of headache, fever, chills, body ache, sore throat.  He has not had a recent close exposure to someone with proven streptococcal pharyngitis.  States he is checking blood sugars occasionally and reading have been approximately 100 fasting.   Treatment to date: antihistamines.  ? sick contacts.  No other aggravating or relieving factors.  No other c/o.  Smoking less than 1 pack per day. Drinking alcohol daily still, at least one per day.   The following portions of the patient's history were reviewed and updated as appropriate: allergies, current medications, past medical history, past social history, past surgical history and problem list.  ROS as in subjective   Objective: Filed Vitals:   05/21/15 1442  BP: 108/68  Pulse: 68  Temp: 98.9 F (37.2 C)    General appearance: no distress, WD/WN, is mildly ill appearing. Diaphoretic. HEENT: normocephalic, conjunctiva/corneas normal, sclerae anicteric, nares patent, no discharge or erythema, pharynx with erythema, without exudate.  Oral cavity: MMM, no lesions  Neck: supple, no lymphadenopathy, no thyromegaly Heart: RRR, normal S1, S2, no murmurs Lungs: CTA bilaterally, no wheezes, rhonchi, or rales    Laboratory Strep test done. Results:positive.    Flu swab done: negative  Assessment and Plan: Acute streptococcal pharyngitis - Plan: amoxicillin (AMOXIL) 875 MG tablet  Uncontrolled type 2 diabetes mellitus without complication, without long-term current use of insulin (HCC)  Smoker  Alcohol abuse, daily use   Advised that symptoms and exam suggest a streptococcal etiology.  Prescription for amoxicillin sent to pharmacy. Discussed symptomatic treatment including salt water gargles, warm fluids, rest, hydrate well, can use over-the-counter Tylenol or Ibuprofen for throat pain, fever, or malaise. Recommend patient return in 12-14 days to  repeat rapid strep and to assess for treatment of cure since he was diagnosed and treated for strep throat 02/26/2015. Will also need to follow up on Diabetes and get another A1c. Recommend that he cut back on smoking and alcohol use. Patient is not ready to quit.

## 2015-05-21 NOTE — Patient Instructions (Addendum)
Take the amoxicillin twice daily with food for next 10 days and return in 12-14 days to have strep test.  Stay well hydrated.   Strep Throat Strep throat is a bacterial infection of the throat. Your health care provider may call the infection tonsillitis or pharyngitis, depending on whether there is swelling in the tonsils or at the back of the throat. Strep throat is most common during the cold months of the year in children who are 72-58 years of age, but it can happen during any season in people of any age. This infection is spread from person to person (contagious) through coughing, sneezing, or close contact. CAUSES Strep throat is caused by the bacteria called Streptococcus pyogenes. RISK FACTORS This condition is more likely to develop in:  People who spend time in crowded places where the infection can spread easily.  People who have close contact with someone who has strep throat. SYMPTOMS Symptoms of this condition include:  Fever or chills.   Redness, swelling, or pain in the tonsils or throat.  Pain or difficulty when swallowing.  White or yellow spots on the tonsils or throat.  Swollen, tender glands in the neck or under the jaw.  Red rash all over the body (rare). DIAGNOSIS This condition is diagnosed by performing a rapid strep test or by taking a swab of your throat (throat culture test). Results from a rapid strep test are usually ready in a few minutes, but throat culture test results are available after one or two days. TREATMENT This condition is treated with antibiotic medicine. HOME CARE INSTRUCTIONS Medicines  Take over-the-counter and prescription medicines only as told by your health care provider.  Take your antibiotic as told by your health care provider. Do not stop taking the antibiotic even if you start to feel better.  Have family members who also have a sore throat or fever tested for strep throat. They may need antibiotics if they have the strep  infection. Eating and Drinking  Do not share food, drinking cups, or personal items that could cause the infection to spread to other people.  If swallowing is difficult, try eating soft foods until your sore throat feels better.  Drink enough fluid to keep your urine clear or pale yellow. General Instructions  Gargle with a salt-water mixture 3-4 times per day or as needed. To make a salt-water mixture, completely dissolve -1 tsp of salt in 1 cup of warm water.  Make sure that all household members wash their hands well.  Get plenty of rest.  Stay home from school or work until you have been taking antibiotics for 24 hours.  Keep all follow-up visits as told by your health care provider. This is important. SEEK MEDICAL CARE IF:  The glands in your neck continue to get bigger.  You develop a rash, cough, or earache.  You cough up a thick liquid that is green, yellow-brown, or bloody.  You have pain or discomfort that does not get better with medicine.  Your problems seem to be getting worse rather than better.  You have a fever. SEEK IMMEDIATE MEDICAL CARE IF:  You have new symptoms, such as vomiting, severe headache, stiff or painful neck, chest pain, or shortness of breath.  You have severe throat pain, drooling, or changes in your voice.  You have swelling of the neck, or the skin on the neck becomes red and tender.  You have signs of dehydration, such as fatigue, dry mouth, and decreased urination.  You become increasingly sleepy, or you cannot wake up completely.  Your joints become red or painful.   This information is not intended to replace advice given to you by your health care provider. Make sure you discuss any questions you have with your health care provider.   Document Released: 03/07/2000 Document Revised: 11/29/2014 Document Reviewed: 07/03/2014 Elsevier Interactive Patient Education [employer].

## 2015-05-22 ENCOUNTER — Telehealth: Payer: Self-pay | Admitting: Family Medicine

## 2015-05-22 LAB — POC INFLUENZA A&B (BINAX/QUICKVUE)
Influenza A, POC: NEGATIVE
Influenza B, POC: NEGATIVE

## 2015-05-22 NOTE — Addendum Note (Signed)
Addended by: Minette Headland A on: 05/22/2015 10:34 AM   Modules accepted: Orders

## 2015-05-22 NOTE — Telephone Encounter (Signed)
Pt states head hurting bad, over left eye, especially when bends over or moves head, effecting sleep.  Asks that you call him back.

## 2015-05-22 NOTE — Telephone Encounter (Signed)
Pt notified of vickie's recommendations

## 2015-05-22 NOTE — Telephone Encounter (Signed)
Does he have any other symptoms such as fever, neck stiffness? Has he taken anything like tylenol or Ibuprofen for his headache? If not, I recommend that he take Ibuprofen 800 mg 3 times daily for next 2-3 days, his headache may be related to his current infection or he could also have some sinus issues causing this, the antibiotic should help if this is the case.  Make sure he is drinking plenty of water and try some caffeine also for headache treatment. Dehydration can cause headache.

## 2015-06-04 ENCOUNTER — Encounter: Payer: Self-pay | Admitting: Family Medicine

## 2015-06-04 ENCOUNTER — Ambulatory Visit (INDEPENDENT_AMBULATORY_CARE_PROVIDER_SITE_OTHER): Payer: Managed Care, Other (non HMO) | Admitting: Family Medicine

## 2015-06-04 VITALS — BP 128/80 | HR 64 | Wt 215.6 lb

## 2015-06-04 DIAGNOSIS — Z09 Encounter for follow-up examination after completed treatment for conditions other than malignant neoplasm: Secondary | ICD-10-CM

## 2015-06-04 LAB — POCT RAPID STREP A (OFFICE): RAPID STREP A SCREEN: NEGATIVE

## 2015-06-04 NOTE — Progress Notes (Signed)
   Subjective:    Patient ID: [name], male    DOB: 1955/06/06, 60 y.o.   MRN: [id number]  HPI Chief Complaint  Patient presents with  . follow-up    follow-up on strep. no issues   He is here for follow-up on strep pharyngitis. He had 2 positive strep swabs in December and again in February. He completed course of Amoxicillin and here for recheck to make sure this cleared up.  He has no complaints today.  Denies fever, chills, sweats, sore throat, nasal drainage or cough.    Review of Systems Pertinent positives and negatives in the history of present illness.     Objective:   Physical Exam BP 128/80 mmHg  Pulse 64  Wt 215 lb 9.6 oz (97.796 kg)  Alert and in no distress. Tympanic membranes and canals are normal. Pharyngeal area is normal. Neck is supple without adenopathy or thyromegaly.   Rapid strep swab: negative     Assessment & Plan:  Follow-up exam after treatment - Plan: POCT rapid strep A  Discussed that his strep has cleared. Will follow up for diabetes check in next few weeks.

## 2015-06-04 NOTE — Patient Instructions (Signed)
Return in the next 2-3 weeks for Diabetes follow up appointment.

## 2015-06-18 ENCOUNTER — Ambulatory Visit (INDEPENDENT_AMBULATORY_CARE_PROVIDER_SITE_OTHER): Payer: Managed Care, Other (non HMO) | Admitting: Family Medicine

## 2015-06-18 ENCOUNTER — Encounter: Payer: Self-pay | Admitting: Family Medicine

## 2015-06-18 VITALS — BP 110/70 | HR 64 | Wt 210.4 lb

## 2015-06-18 DIAGNOSIS — M25512 Pain in left shoulder: Secondary | ICD-10-CM

## 2015-06-18 DIAGNOSIS — IMO0001 Reserved for inherently not codable concepts without codable children: Secondary | ICD-10-CM

## 2015-06-18 DIAGNOSIS — K219 Gastro-esophageal reflux disease without esophagitis: Secondary | ICD-10-CM

## 2015-06-18 DIAGNOSIS — Z8739 Personal history of other diseases of the musculoskeletal system and connective tissue: Secondary | ICD-10-CM

## 2015-06-18 DIAGNOSIS — E1165 Type 2 diabetes mellitus with hyperglycemia: Secondary | ICD-10-CM | POA: Diagnosis not present

## 2015-06-18 LAB — POCT GLYCOSYLATED HEMOGLOBIN (HGB A1C)

## 2015-06-18 MED ORDER — ESOMEPRAZOLE MAGNESIUM 20 MG PO TBEC: 22.3000 mg | DELAYED_RELEASE_TABLET | Freq: Every day | ORAL | Status: DC

## 2015-06-18 MED ORDER — MICROLET LANCETS MISC: Status: AC

## 2015-06-18 NOTE — Patient Instructions (Addendum)
Schedule an appointment with Dr. Redmond School for possible left shoulder injection.  Take 1 Nexium 30 minutes before breakfast and avoid foods that upset your stomach. If the Nexium helps you can buy this over the counter.   Gastroesophageal Reflux Disease, Adult Normally, food travels down the esophagus and stays in the stomach to be digested. However, when a person has gastroesophageal reflux disease (GERD), food and stomach acid move back up into the esophagus. When this happens, the esophagus becomes sore and inflamed. Over time, GERD can create small holes (ulcers) in the lining of the esophagus.  CAUSES This condition is caused by a problem with the muscle between the esophagus and the stomach (lower esophageal sphincter, or LES). Normally, the LES muscle closes after food passes through the esophagus to the stomach. When the LES is weakened or abnormal, it does not close properly, and that allows food and stomach acid to go back up into the esophagus. The LES can be weakened by certain dietary substances, medicines, and medical conditions, including:  Tobacco use.  Pregnancy.  Having a hiatal hernia.  Heavy alcohol use.  Certain foods and beverages, such as coffee, chocolate, onions, and peppermint. RISK FACTORS This condition is more likely to develop in:  People who have an increased body weight.  People who have connective tissue disorders.  People who use NSAID medicines. SYMPTOMS Symptoms of this condition include:  Heartburn.  Difficult or painful swallowing.  The feeling of having a lump in the throat.  Abitter taste in the mouth.  Bad breath.  Having a large amount of saliva.  Having an upset or bloated stomach.  Belching.  Chest pain.  Shortness of breath or wheezing.  Ongoing (chronic) cough or a night-time cough.  Wearing away of tooth enamel.  Weight loss. Different conditions can cause chest pain. Make sure to see your health care provider if you  experience chest pain. DIAGNOSIS Your health care provider will take a medical history and perform a physical exam. To determine if you have mild or severe GERD, your health care provider may also monitor how you respond to treatment. You may also have other tests, including:  An endoscopy toexamine your stomach and esophagus with a small camera.  A test thatmeasures the acidity level in your esophagus.  A test thatmeasures how much pressure is on your esophagus.  A barium swallow or modified barium swallow to show the shape, size, and functioning of your esophagus. TREATMENT The goal of treatment is to help relieve your symptoms and to prevent complications. Treatment for this condition may vary depending on how severe your symptoms are. Your health care provider may recommend:  Changes to your diet.  Medicine.  Surgery. HOME CARE INSTRUCTIONS Diet  Follow a diet as recommended by your health care provider. This may involve avoiding foods and drinks such as:  Coffee and tea (with or without caffeine).  Drinks that containalcohol.  Energy drinks and sports drinks.  Carbonated drinks or sodas.  Chocolate and cocoa.  Peppermint and mint flavorings.  Garlic and onions.  Horseradish.  Spicy and acidic foods, including peppers, chili powder, curry powder, vinegar, hot sauces, and barbecue sauce.  Citrus fruit juices and citrus fruits, such as oranges, lemons, and limes.  Tomato-based foods, such as red sauce, chili, salsa, and pizza with red sauce.  Fried and fatty foods, such as donuts, french fries, potato chips, and high-fat dressings.  High-fat meats, such as hot dogs and fatty cuts of red and white meats,  such as rib eye steak, sausage, ham, and bacon.  High-fat dairy items, such as whole milk, butter, and cream cheese.  Eat small, frequent meals instead of large meals.  Avoid drinking large amounts of liquid with your meals.  Avoid eating meals during the  2-3 hours before bedtime.  Avoid lying down right after you eat.  Do not exercise right after you eat. General Instructions  Pay attention to any changes in your symptoms.  Take over-the-counter and prescription medicines only as told by your health care provider. Do not take aspirin, ibuprofen, or other NSAIDs unless your health care provider told you to do so.  Do not use any tobacco products, including cigarettes, chewing tobacco, and e-cigarettes. If you need help quitting, ask your health care provider.  Wear loose-fitting clothing. Do not wear anything tight around your waist that causes pressure on your abdomen.  Raise (elevate) the head of your bed 6 inches (15cm).  Try to reduce your stress, such as with yoga or meditation. If you need help reducing stress, ask your health care provider.  If you are overweight, reduce your weight to an amount that is healthy for you. Ask your health care provider for guidance about a safe weight loss goal.  Keep all follow-up visits as told by your health care provider. This is important. SEEK MEDICAL CARE IF:  You have new symptoms.  You have unexplained weight loss.  You have difficulty swallowing, or it hurts to swallow.  You have wheezing or a persistent cough.  Your symptoms do not improve with treatment.  You have a hoarse voice. SEEK IMMEDIATE MEDICAL CARE IF:  You have pain in your arms, neck, jaw, teeth, or back.  You feel sweaty, dizzy, or light-headed.  You have chest pain or shortness of breath.  You vomit and your vomit looks like blood or coffee grounds.  You faint.  Your stool is bloody or black.  You cannot swallow, drink, or eat.   This information is not intended to replace advice given to you by your health care provider. Make sure you discuss any questions you have with your health care provider.   Document Released: 12/18/2004 Document Revised: 11/29/2014 Document Reviewed: 07/05/2014 Elsevier  Interactive Patient Education [employer].

## 2015-06-18 NOTE — Progress Notes (Signed)
Subjective:    Patient ID: [name], male    DOB: Oct 24, 1955, 60 y.o.   MRN: [id number]  [name] is a 60 y.o. male who presents for follow-up of Type 2 diabetes mellitus. He also has 2 other complaints. Possible reflux and left shoulder pain.   Patient is checking home blood sugars about once per week.  Home blood sugar records: BGs range between 65 and 210\ How often is blood sugars being checked: once a week Current symptoms include: none. Patient denies none.  Patient is checking their feet daily. Any Foot concerns (callous, ulcer, wound, thickened nails, toenail fungus, skin fungus, hammer toe): none Last dilated eye exam: less than a year ago  Current treatments: Metformin and Glyburide.  Medication compliance: excellent  Current diet: in general, a "healthy" diet   Current exercise: work Known diabetic complications: none  Last eye exam last summer.   Also complains of left shoulder pain that is chronic is nature but has worsening flares occasionally with activities.  Pain is worse with movement, dull ache at rest. Takes Advil 2-3 tablets occasionally. Pain is non radiating, states pain is mostly anterior. States he has had XR in past and it showed arthritis in his shoulder.    He also reports that 3 days ago he ate some spicy chicken wings and then a couple of hours later he developed indigestion, belching, and ended up having 1 episode of vomiting. He did not take any medication for this. He denies history of reflux. Denies fever, chills nausea, vomiting, diarrhea, constipation, abdominal pain.  The following portions of the patient's history were reviewed and updated as appropriate: allergies, current medications, past medical history, past social history and problem list.  ROS as in subjective above.     Objective:    Physical Exam Alert and in no distress. Left shoulder without erythema, edema, nontender. No laxity, negative sulcus. Normal ROM, pain with  full flexion and lift off. Negative drop arm, neers and hawkins. Abdomen soft, nontender, normal bowel sounds.  Blood pressure 110/70, pulse 64, weight 210 lb 6.4 oz (95.437 kg).  Lab Review Diabetic Labs Latest Ref Rng 06/18/2015 02/26/2015 01/24/2015  HbA1c - 7.3% - 8.2%  Creatinine 0.70 - 1.33 mg/dL - 0.90 -   BP/Weight 06/18/2015 06/04/2015 05/21/2015 02/26/2015 123XX123  Systolic BP A999333 0000000 [id number]  Diastolic BP 70 80 68 72 76  Wt. (Lbs) 210.4 215.6 208.2 219.4 219.6  BMI 27.56 28.24 27.27 28.74 28.76   Foot/eye exam completion dates Latest Ref Rng 07/13/2014 07/13/2014  Eye Exam No Retinopathy No Retinopathy No Retinopathy  Foot Form Completion - - -    [name]  reports that he has been smoking Cigarettes.  He has been smoking about 1.00 pack per day. He has never used smokeless tobacco. He reports that he drinks alcohol. He reports that he does not use illicit drugs.     Assessment & Plan:    Uncontrolled type 2 diabetes mellitus without complication, without long-term current use of insulin ([place]) - Plan: POCT glycosylated hemoglobin (Hb A1C)  Gastroesophageal reflux disease, esophagitis presence not specified - Plan: Esomeprazole Magnesium (NEXIUM 24HR) 20 MG TBEC  History of arthritis  Pain in joint of left shoulder  1. Rx changes: none A1c today has improved and today is 7.3, last time was 8.2 2. Education: Reviewed 'ABCs' of diabetes management (respective goals in parentheses):  A1C (<7), blood pressure (<130/80), and cholesterol (LDL <100). 3. Compliance at present is estimated to be  excellent. Efforts to improve compliance (if necessary) will be directed at dietary modifications: low carbohyrate and sugar. . 4. Follow up: 4 months  5. Avoid foods that trigger GERD, written instructions provided. 6. Try nexium 30 minutes before breakfast, samples given.  7. Recommend that he cut back on daily alcohol intake and smoking. 8. Call for evaluation and possible injection to  the left shoulder by Dr. Redmond School.  9. Continue taking Advil for joint pain.

## 2015-06-29 ENCOUNTER — Ambulatory Visit
Admission: RE | Admit: 2015-06-29 | Discharge: 2015-06-29 | Disposition: A | Discharge status: Home / Self Care | Payer: 59 | Source: Ambulatory Visit | Attending: Family Medicine | Admitting: Family Medicine

## 2015-06-29 ENCOUNTER — Ambulatory Visit (INDEPENDENT_AMBULATORY_CARE_PROVIDER_SITE_OTHER): Payer: Managed Care, Other (non HMO) | Admitting: Family Medicine

## 2015-06-29 ENCOUNTER — Encounter: Payer: Self-pay | Admitting: Family Medicine

## 2015-06-29 VITALS — BP 130/70 | HR 74 | Wt 213.8 lb

## 2015-06-29 DIAGNOSIS — M25512 Pain in left shoulder: Secondary | ICD-10-CM | POA: Diagnosis not present

## 2015-06-29 MED ORDER — TRIAMCINOLONE ACETONIDE 40 MG/ML IJ SUSP
40.0000 mg | Freq: Once | INTRAMUSCULAR | Status: AC
  Administered 2015-06-29: 40 mg via INTRAMUSCULAR

## 2015-06-29 MED ORDER — LIDOCAINE HCL 2 % IJ SOLN
3.0000 mL | Freq: Once | INTRAMUSCULAR | Status: AC
  Administered 2015-06-29: 60 mg via INTRADERMAL

## 2015-06-29 NOTE — Progress Notes (Signed)
   Subjective:    Patient ID: [name], male    DOB: Apr 03, 1955, 60 y.o.   MRN: [id number]  HPI He is here for recheck. He was seen and evaluated for shoulder pain. He does work in this is interfering with his work. He has had no popping, locking.    Review of Systems     Objective:   Physical Exam Full range of motion of the shoulder. Abduction and internal as well as external rotation did cause a audible clicking was also palpable. Neer's and Hawkins test also causes same symptoms. No laxity noted. Negative drop arm test. X-ray shows no bony abnormalities.       Assessment & Plan:  Pain in joint of left shoulder - Plan: DG Shoulder Left, lidocaine (XYLOCAINE) 2 % (with pres) injection 60 mg, triamcinolone acetonide (KENALOG-40) injection 40 mg The results of the x-ray and exam were discussed with him in detail. I elected to give him 40 mg of Kenalog and 3 mL of Xylocaine into the subacromial bursa. This was done without difficulty. I explained that if he gets relief, we can repeat this in several months otherwise no relief with indicate need for physical therapy and eventually also MRI.

## 2015-07-31 ENCOUNTER — Encounter: Payer: Self-pay | Admitting: Family Medicine

## 2015-07-31 ENCOUNTER — Ambulatory Visit (INDEPENDENT_AMBULATORY_CARE_PROVIDER_SITE_OTHER): Payer: Managed Care, Other (non HMO) | Admitting: Family Medicine

## 2015-07-31 VITALS — BP 110/70 | HR 93 | Wt 210.0 lb

## 2015-07-31 DIAGNOSIS — K209 Esophagitis, unspecified without bleeding: Secondary | ICD-10-CM

## 2015-07-31 NOTE — Patient Instructions (Signed)
Take a tablespoon or 2 of either liquid Maalox or Mylanta every several hours.  No beer or any other alcohol for right now

## 2015-07-31 NOTE — Progress Notes (Signed)
   Subjective:    Patient ID: [name], male    DOB: [birth year], 60 y.o.   MRN: [id number]  HPI  he states that Saturday he was having difficulty with a dry cough and subsequently vomited and developed midsternal burning pain. He states that liquid or solids cause pain to get much worse. He did take one Nexium with no relief. He had a similar episode approximately 6 weeks ago and did take several days of Nexium with minimal relief of symptoms.   Review of Systems     Objective:   Physical Exam  alert and in no distress. Cardiac exam shows regular rhythm without murmurs or gallops. Lungs clear to auscultation. Abdominal exam shows active bowel sounds without masses or tenderness.       Assessment & Plan:  Acute esophagitis  I will try a different approach and have him use liquid Maalox or Mylanta regularly throughout the day. He will let me know next week how he is doing.

## 2015-10-03 ENCOUNTER — Ambulatory Visit (INDEPENDENT_AMBULATORY_CARE_PROVIDER_SITE_OTHER): Payer: Managed Care, Other (non HMO) | Admitting: Family Medicine

## 2015-10-03 ENCOUNTER — Encounter: Payer: Self-pay | Admitting: Family Medicine

## 2015-10-03 VITALS — BP 122/72 | HR 64 | Wt 216.8 lb

## 2015-10-03 DIAGNOSIS — E119 Type 2 diabetes mellitus without complications: Secondary | ICD-10-CM | POA: Diagnosis not present

## 2015-10-03 DIAGNOSIS — E1165 Type 2 diabetes mellitus with hyperglycemia: Secondary | ICD-10-CM

## 2015-10-03 DIAGNOSIS — F1010 Alcohol abuse, uncomplicated: Secondary | ICD-10-CM | POA: Diagnosis not present

## 2015-10-03 DIAGNOSIS — I10 Essential (primary) hypertension: Secondary | ICD-10-CM

## 2015-10-03 DIAGNOSIS — F17200 Nicotine dependence, unspecified, uncomplicated: Secondary | ICD-10-CM

## 2015-10-03 DIAGNOSIS — IMO0001 Reserved for inherently not codable concepts without codable children: Secondary | ICD-10-CM

## 2015-10-03 DIAGNOSIS — Z720 Tobacco use: Secondary | ICD-10-CM

## 2015-10-03 DIAGNOSIS — H7392 Unspecified disorder of tympanic membrane, left ear: Secondary | ICD-10-CM | POA: Diagnosis not present

## 2015-10-03 DIAGNOSIS — H9312 Tinnitus, left ear: Secondary | ICD-10-CM | POA: Insufficient documentation

## 2015-10-03 DIAGNOSIS — H9192 Unspecified hearing loss, left ear: Secondary | ICD-10-CM | POA: Insufficient documentation

## 2015-10-03 DIAGNOSIS — Z89422 Acquired absence of other left toe(s): Secondary | ICD-10-CM | POA: Diagnosis not present

## 2015-10-03 DIAGNOSIS — H7390 Unspecified disorder of tympanic membrane, unspecified ear: Secondary | ICD-10-CM | POA: Insufficient documentation

## 2015-10-03 DIAGNOSIS — Z89429 Acquired absence of other toe(s), unspecified side: Secondary | ICD-10-CM | POA: Insufficient documentation

## 2015-10-03 LAB — POCT GLYCOSYLATED HEMOGLOBIN (HGB A1C): Hemoglobin A1C: 6.4

## 2015-10-03 MED ORDER — LISINOPRIL-HYDROCHLOROTHIAZIDE 10-12.5 MG PO TABS: 1.0000 | ORAL_TABLET | Freq: Every day | ORAL | Status: DC

## 2015-10-03 MED ORDER — GLYBURIDE 1.25 MG PO TABS: 1.2500 mg | ORAL_TABLET | Freq: Every day | ORAL | Status: DC

## 2015-10-03 MED ORDER — METFORMIN HCL 1000 MG PO TABS: 1000.0000 mg | ORAL_TABLET | Freq: Every day | ORAL | Status: DC

## 2015-10-03 NOTE — Patient Instructions (Addendum)
Follow up with ENT for left ear.  I am going to refer you to podiatry to follow up on left foot.
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
## 2015-10-22 ENCOUNTER — Ambulatory Visit (INDEPENDENT_AMBULATORY_CARE_PROVIDER_SITE_OTHER): Payer: Managed Care, Other (non HMO) | Admitting: Podiatry

## 2015-10-22 ENCOUNTER — Encounter: Payer: Self-pay | Admitting: Podiatry

## 2015-10-22 VITALS — BP 139/72 | HR 84 | Resp 12

## 2015-10-22 DIAGNOSIS — E119 Type 2 diabetes mellitus without complications: Secondary | ICD-10-CM | POA: Diagnosis not present

## 2015-10-22 DIAGNOSIS — L84 Corns and callosities: Secondary | ICD-10-CM

## 2015-10-22 MED ORDER — UREA 40 % EX CREA: 1.0000 "application " | TOPICAL_CREAM | Freq: Every day | CUTANEOUS | 2 refills | Status: DC
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
## 2015-10-29 DIAGNOSIS — L84 Corns and callosities: Secondary | ICD-10-CM

## 2015-12-17 ENCOUNTER — Ambulatory Visit: Payer: Managed Care, Other (non HMO) | Admitting: Podiatry

## 2015-12-20 ENCOUNTER — Encounter: Payer: Self-pay | Admitting: Family Medicine

## 2015-12-20 ENCOUNTER — Ambulatory Visit (INDEPENDENT_AMBULATORY_CARE_PROVIDER_SITE_OTHER): Payer: Managed Care, Other (non HMO) | Admitting: Family Medicine

## 2015-12-20 VITALS — BP 140/70 | HR 62 | Temp 98.0°F | Wt 218.2 lb

## 2015-12-20 DIAGNOSIS — R109 Unspecified abdominal pain: Secondary | ICD-10-CM | POA: Diagnosis not present

## 2015-12-20 DIAGNOSIS — R81 Glycosuria: Secondary | ICD-10-CM | POA: Diagnosis not present

## 2015-12-20 LAB — POCT URINALYSIS DIPSTICK
Bilirubin, UA: NEGATIVE
Ketones, UA: NEGATIVE
Leukocytes, UA: NEGATIVE
NITRITE UA: NEGATIVE
PH UA: 6
PROTEIN UA: NEGATIVE
RBC UA: NEGATIVE
UROBILINOGEN UA: NEGATIVE

## 2015-12-20 LAB — GLUCOSE, POCT (MANUAL RESULT ENTRY): POC GLUCOSE: 199 mg/dL — AB (ref 70–99)

## 2015-12-20 NOTE — Patient Instructions (Signed)
Try heat to the area and take advil as needed. If pain gets worse let me know.

## 2015-12-20 NOTE — Progress Notes (Signed)
   Subjective:    Patient ID: [name], male    DOB: 01-21-1956, 60 y.o.   MRN: [id number]  HPI Chief Complaint  Patient presents with  . right side pain    right side pain- started monday morning and has eased up some.    He is here with complaints of right flank pain that started 4 days ago when he got out of bed. Denies injury.  Describes pain as a "pulling sensation". Pain is worse with movement. Non radiating. Pain is improved with rest.  Tried advil 2 tablets at home.  States pain is 70 % better than when it started.  Denies fever, chills, chest pain, palpitations, cough, shortness of breath, abdominal pain, nausea, vomiting, diarrhea, urinary frequency, urgency, hematuria.   States he is has been taking his daily medications. No increased thirst, urinary frequency. State he has been eating more cookies and sugars over the past 2 weeks.  States he had pancakes this morning.   He reports he still drinks 32 oz of beer daily and is still smoking. No other concerns or questions.   Reviewed allergies, medications, past medical,  and social history.   Review of Systems Pertinent positives and negatives in the history of present illness.     Objective:   Physical Exam  Constitutional: He is oriented to person, place, and time. He appears well-developed and well-nourished. No distress.  Neck: Normal range of motion. Neck supple.  Cardiovascular: Normal rate, regular rhythm and normal heart sounds.  Exam reveals no gallop and no friction rub.   No murmur heard. Pulmonary/Chest: Effort normal and breath sounds normal.  Abdominal: Soft. Bowel sounds are normal. He exhibits no distension. There is no tenderness. There is no rebound.  Musculoskeletal:       Thoracic back: Normal.       Lumbar back: Normal.       Back:  Pain with back extension, lateral bending, right sided lateral twisting. No erythema, rash, bruising, or deformity. Normal sensation. Back with normal ROM. Non  tender.   Neurological: He is alert and oriented to person, place, and time.  Skin: Skin is warm and dry. No rash noted. No erythema.  Psychiatric: He has a normal mood and affect. His behavior is normal. Judgment and thought content normal.   BP 140/70   Pulse 62   Temp 98 F (36.7 C) (Oral)   Wt 218 lb 3.2 oz (99 kg)   BMI 28.59 kg/m    Urinalysis dipstick: spec grav 1.030, glucose 3+, negative otherwise Finger stick random glucose: 199     Assessment & Plan:  Right flank pain - Plan: Urinalysis Dipstick  Glucosuria - Plan: Urinalysis Dipstick, POCT glucose (manual entry)  Discussed that his symptoms appear to be related to a musculoskeletal etiology and less likely a kidney stone. No systemic symptoms. He is 70% improved at this point and I recommend continuing conservative therapy and call/return if symptoms worsen.  Discussed that he is spilling glucose in his urine and advised to keep a closer eye on diet, he has been eating more sugar and carbohydrates lately. Glucose today is 199 two hours after eating pancakes. He reports that he is taking his daily diabetes medications.  He is scheduled for a CPE next month. Will check a hemoglobin A1C at that time.

## 2016-02-04 ENCOUNTER — Ambulatory Visit (INDEPENDENT_AMBULATORY_CARE_PROVIDER_SITE_OTHER): Payer: Managed Care, Other (non HMO) | Admitting: Family Medicine

## 2016-02-04 ENCOUNTER — Encounter: Payer: Self-pay | Admitting: Family Medicine

## 2016-02-04 ENCOUNTER — Encounter: Payer: Self-pay | Admitting: Gastroenterology

## 2016-02-04 VITALS — BP 122/80 | HR 64 | Ht 73.25 in | Wt 220.0 lb

## 2016-02-04 DIAGNOSIS — Z113 Encounter for screening for infections with a predominantly sexual mode of transmission: Secondary | ICD-10-CM

## 2016-02-04 DIAGNOSIS — M545 Low back pain, unspecified: Secondary | ICD-10-CM

## 2016-02-04 DIAGNOSIS — I10 Essential (primary) hypertension: Secondary | ICD-10-CM

## 2016-02-04 DIAGNOSIS — F17200 Nicotine dependence, unspecified, uncomplicated: Secondary | ICD-10-CM | POA: Diagnosis not present

## 2016-02-04 DIAGNOSIS — F1010 Alcohol abuse, uncomplicated: Secondary | ICD-10-CM | POA: Diagnosis not present

## 2016-02-04 DIAGNOSIS — Z125 Encounter for screening for malignant neoplasm of prostate: Secondary | ICD-10-CM

## 2016-02-04 DIAGNOSIS — E119 Type 2 diabetes mellitus without complications: Secondary | ICD-10-CM

## 2016-02-04 DIAGNOSIS — J069 Acute upper respiratory infection, unspecified: Secondary | ICD-10-CM

## 2016-02-04 DIAGNOSIS — G479 Sleep disorder, unspecified: Secondary | ICD-10-CM | POA: Diagnosis not present

## 2016-02-04 DIAGNOSIS — Z0000 Encounter for general adult medical examination without abnormal findings: Secondary | ICD-10-CM

## 2016-02-04 DIAGNOSIS — Z136 Encounter for screening for cardiovascular disorders: Secondary | ICD-10-CM | POA: Diagnosis not present

## 2016-02-04 DIAGNOSIS — Z1211 Encounter for screening for malignant neoplasm of colon: Secondary | ICD-10-CM | POA: Diagnosis not present

## 2016-02-04 LAB — POCT URINALYSIS DIPSTICK
Bilirubin, UA: NEGATIVE
Blood, UA: NEGATIVE
GLUCOSE UA: NEGATIVE
Ketones, UA: NEGATIVE
Leukocytes, UA: NEGATIVE
NITRITE UA: NEGATIVE
Protein, UA: NEGATIVE
Spec Grav, UA: 1.025
UROBILINOGEN UA: NEGATIVE
pH, UA: 6

## 2016-02-04 LAB — CBC WITH DIFFERENTIAL/PLATELET
BASOS PCT: 1 %
Basophils Absolute: 54 cells/uL (ref 0–200)
Eosinophils Absolute: 108 cells/uL (ref 15–500)
Eosinophils Relative: 2 %
HEMATOCRIT: 45.4 % (ref 38.5–50.0)
HEMOGLOBIN: 15.3 g/dL (ref 13.2–17.1)
LYMPHS ABS: 2754 {cells}/uL (ref 850–3900)
Lymphocytes Relative: 51 %
MCH: 30.6 pg (ref 27.0–33.0)
MCHC: 33.7 g/dL (ref 32.0–36.0)
MCV: 90.8 fL (ref 80.0–100.0)
MONO ABS: 432 {cells}/uL (ref 200–950)
MPV: 9.3 fL (ref 7.5–12.5)
Monocytes Relative: 8 %
NEUTROS ABS: 2052 {cells}/uL (ref 1500–7800)
Neutrophils Relative %: 38 %
Platelets: 274 10*3/uL (ref 140–400)
RBC: 5 MIL/uL (ref 4.20–5.80)
RDW: 13.3 % (ref 11.0–15.0)
WBC: 5.4 10*3/uL (ref 4.0–10.5)

## 2016-02-04 LAB — LIPID PANEL
CHOLESTEROL: 169 mg/dL (ref <200)
HDL: 66 mg/dL (ref >40)
LDL Cholesterol: 89 mg/dL (ref <100)
Total CHOL/HDL Ratio: 2.6 Ratio (ref <5.0)
Triglycerides: 71 mg/dL (ref <150)
VLDL: 14 mg/dL (ref <30)

## 2016-02-04 LAB — COMPLETE METABOLIC PANEL WITH GFR
ALBUMIN: 4.4 g/dL (ref 3.6–5.1)
ALT: 25 U/L (ref 9–46)
AST: 29 U/L (ref 10–35)
Alkaline Phosphatase: 51 U/L (ref 40–115)
BUN: 9 mg/dL (ref 7–25)
CALCIUM: 9.3 mg/dL (ref 8.6–10.3)
CHLORIDE: 101 mmol/L (ref 98–110)
CO2: 24 mmol/L (ref 20–31)
CREATININE: 0.92 mg/dL (ref 0.70–1.33)
GFR, Est African American: >89 mL/min (ref >=60)
GFR, Est Non African American: >89 mL/min (ref >=60)
GLUCOSE: 93 mg/dL (ref 65–99)
Potassium: 4.2 mmol/L (ref 3.5–5.3)
Sodium: 135 mmol/L (ref 135–146)
Total Bilirubin: 0.5 mg/dL (ref 0.2–1.2)
Total Protein: 7.3 g/dL (ref 6.1–8.1)

## 2016-02-04 LAB — TSH: TSH: 0.62 mIU/L (ref 0.40–4.50)

## 2016-02-04 LAB — PSA: PSA: 0.5 ng/mL (ref <=4.0)

## 2016-02-04 LAB — POCT GLYCOSYLATED HEMOGLOBIN (HGB A1C)

## 2016-02-04 NOTE — Patient Instructions (Addendum)
Try to stop smoking and cut back on daily alcohol use.  Your hemoglobin A1C today is 7.0% and is still at goal but at the upper limit so watch your sugar, carbs and starch intake.  Follow up in 6 months for Diabetes as long as your blood work is ok.  You will receive a call from the gastroenterologist office to schedule an appointment to discuss colonoscopy.   I recommend you go for eye exam and dental exam.   We will call you with lab results.   For your side pain, I recommend trying heat to it and taking advil or motrin or aleve and see if this helps.  You can also try the topical pain medication such as capcaisin or salon pas, these are all over the counter.  If it is not improving you can let me know.   For sleep, try not watching TV and making sure the room is cool, dark and no lights are on when you go to bed. If you are laying awake more than 30 minutes then get up and do something until you feel like you can go to sleep. Avoid taking naps and this will help you sleep longer. Also, avoid alcohol or caffeine at least 3 hours before you go to bed.  These things are all to improve your sleep hygiene.  Try melatonin 3 mg or 5 mg   Preventative Care for Adults, Male       REGULAR HEALTH EXAMS:  A routine yearly physical is a good way to check in with your primary care provider about your health and preventive screening. It is also an opportunity to share updates about your health and any concerns you have, and receive a thorough all-over exam.   Most health insurance companies pay for at least some preventative services.  Check with your health plan for specific coverages.  WHAT PREVENTATIVE SERVICES DO MEN NEED?  Adult men should have their weight and blood pressure checked regularly.   Men age 3 and older should have their cholesterol levels checked regularly.  Beginning at age 14 and continuing to age 102, men should be screened for colorectal cancer.  Certain people should may  need continued testing until age 32.  Other cancer screening may include exams for testicular and prostate cancer.  Updating vaccinations is part of preventative care.  Vaccinations help protect against diseases such as the flu.  Lab tests are generally done as part of preventative care to screen for anemia and blood disorders, to screen for problems with the kidneys and liver, to screen for bladder problems, to check blood sugar, and to check your cholesterol level.  Preventative services generally include counseling about diet, exercise, avoiding tobacco, drugs, excessive alcohol consumption, and sexually transmitted infections.    GENERAL RECOMMENDATIONS FOR GOOD HEALTH:  Healthy diet:  Eat a variety of foods, including fruit, vegetables, animal or vegetable protein, such as meat, fish, chicken, and eggs, or beans, lentils, tofu, and grains, such as rice.  Drink plenty of water daily.  Decrease saturated fat in the diet, avoid lots of red meat, processed foods, sweets, fast foods, and fried foods.  Exercise:  Aerobic exercise helps maintain good heart health. At least 30-40 minutes of moderate-intensity exercise is recommended. For example, a brisk walk that increases your heart rate and breathing. This should be done on most days of the week.   Find a type of exercise or a variety of exercises that you enjoy so that it becomes  a part of your daily life.  Examples are running, walking, swimming, water aerobics, and biking.  For motivation and support, explore group exercise such as aerobic class, spin class, Zumba, Yoga,or  martial arts, etc.    Set exercise goals for yourself, such as a certain weight goal, walk or run in a race such as a 5k walk/run.  Speak to your primary care provider about exercise goals.  Disease prevention:  If you smoke or chew tobacco, find out from your caregiver how to quit. It can literally save your life, no matter how long you have been a tobacco user.  If you do not use tobacco, never begin.   Maintain a healthy diet and normal weight. Increased weight leads to problems with blood pressure and diabetes.   The Body Mass Index or BMI is a way of measuring how much of your body is fat. Having a BMI above 27 increases the risk of heart disease, diabetes, hypertension, stroke and other problems related to obesity. Your caregiver can help determine your BMI and based on it develop an exercise and dietary program to help you achieve or maintain this important measurement at a healthful level.  High blood pressure causes heart and blood vessel problems.  Persistent high blood pressure should be treated with medicine if weight loss and exercise do not work.   Fat and cholesterol leaves deposits in your arteries that can block them. This causes heart disease and vessel disease elsewhere in your body.  If your cholesterol is found to be high, or if you have heart disease or certain other medical conditions, then you may need to have your cholesterol monitored frequently and be treated with medication.   Ask if you should have a stress test if your history suggests this. A stress test is a test done on a treadmill that looks for heart disease. This test can find disease prior to there being a problem.  Avoid drinking alcohol in excess (more than two drinks per day).  Avoid use of street drugs. Do not share needles with anyone. Ask for professional help if you need assistance or instructions on stopping the use of alcohol, cigarettes, and/or drugs.  Brush your teeth twice a day with fluoride toothpaste, and floss once a day. Good oral hygiene prevents tooth decay and gum disease. The problems can be painful, unattractive, and can cause other health problems. Visit your dentist for a routine oral and dental check up and preventive care every 6-12 months.   Look at your skin regularly.  Use a mirror to look at your back. Notify your caregivers of changes in  moles, especially if there are changes in shapes, colors, a size larger than a pencil eraser, an irregular border, or development of new moles.  Safety:  Use seatbelts 100% of the time, whether driving or as a passenger.  Use safety devices such as hearing protection if you work in environments with loud noise or significant background noise.  Use safety glasses when doing any work that could send debris in to the eyes.  Use a helmet if you ride a bike or motorcycle.  Use appropriate safety gear for contact sports.  Talk to your caregiver about gun safety.  Use sunscreen with a SPF (or skin protection factor) of 15 or greater.  Lighter skinned people are at a greater risk of skin cancer. Don't forget to also wear sunglasses in order to protect your eyes from too much damaging sunlight. Damaging sunlight can accelerate  cataract formation.   Practice safe sex. Use condoms. Condoms are used for birth control and to help reduce the spread of sexually transmitted infections (or STIs).  Some of the STIs are gonorrhea (the clap), chlamydia, syphilis, trichomonas, herpes, HPV (human papilloma virus) and HIV (human immunodeficiency virus) which causes AIDS. The herpes, HIV and HPV are viral illnesses that have no cure. These can result in disability, cancer and death.   Keep carbon monoxide and smoke detectors in your home functioning at all times. Change the batteries every 6 months or use a model that plugs into the wall.   Vaccinations:  Stay up to date with your tetanus shots and other required immunizations. You should have a booster for tetanus every 10 years. Be sure to get your flu shot every year, since 5%-20% of the [place] population comes down with the flu. The flu vaccine changes each year, so being vaccinated once is not enough. Get your shot in the fall, before the flu season peaks.   Other vaccines to consider:  Pneumococcal vaccine to protect against certain types of pneumonia.  This is  normally recommended for adults age 74 or older.  However, adults younger than 58 years old with certain underlying conditions such as diabetes, heart or lung disease should also receive the vaccine.  Shingles vaccine to protect against Varicella Zoster if you are older than age 71, or younger than 58 years old with certain underlying illness.  Hepatitis A vaccine to protect against a form of infection of the liver by a virus acquired from food.  Hepatitis B vaccine to protect against a form of infection of the liver by a virus acquired from blood or body fluids, particularly if you work in health care.  If you plan to travel internationally, check with your local health department for specific vaccination recommendations.  Cancer Screening:  Most routine colon cancer screening begins at the age of 42. On a yearly basis, doctors may provide special easy to use take-home tests to check for hidden blood in the stool. Sigmoidoscopy or colonoscopy can detect the earliest forms of colon cancer and is life saving. These tests use a small camera at the end of a tube to directly examine the colon. Speak to your caregiver about this at age 62, when routine screening begins (and is repeated every 5 years unless early forms of pre-cancerous polyps or small growths are found).   At the age of 34 men usually start screening for prostate cancer every year. Screening may begin at a younger age for those with higher risk. Those at higher risk include African-Americans or having a family history of prostate cancer. There are two types of tests for prostate cancer:   Prostate-specific antigen (PSA) testing. Recent studies raise questions about prostate cancer using PSA and you should discuss this with your caregiver.   Digital rectal exam (in which your doctor's lubricated and gloved finger feels for enlargement of the prostate through the anus).   Screening for testicular cancer.  Do a monthly exam of your  testicles. Gently roll each testicle between your thumb and fingers, feeling for any abnormal lumps. The best time to do this is after a hot shower or bath when the tissues are looser. Notify your caregivers of any lumps, tenderness or changes in size or shape immediately.

## 2016-02-04 NOTE — Progress Notes (Signed)
Subjective:    Patient ID: [name], male    DOB: [birth year], 58 y.o.   MRN: [id number]  HPI Chief Complaint  Patient presents with  . fasting cpe    fasting cpe. med check. right side is still hurting. declines flu shot today   He is here for a complete physical exam. Complains of a 2 week history of acute URI symptoms that include sinus pressure,  headache, rhinorrhea. States this has been improving.  No ear pain, sore throat, PND, cough.   Right flank pain that has been ongoing for approximately a few months. Pain is intermittent with certain movements. Non radiating. Feels like an ache, dull mainly. He does not always have pain, in fact, he states he did not have pain for several weeks but then he lifted something at work and thinks it flared up.  Not taking anything for the pain. Is not using heat or ice. Pain is not keeping him from his daily activities.   Complains of sleep issues that has been ongoing for years. States he snores but does not stop breathing. States he typically goes to sleep fine and sleeps about 2-3 hours but he then wakes up and has a tough time going back to sleep so he watches TV. Has a TV in his bedroom. Lays in bed awake for hours until he gets up and eats breakfast and then he sleeps for another 2-3 hours.  Drinks a beer before bed.  Works 3:30 pm until midnight. Has been doing this for years. He has tried medication in the past but does not like to feel "drugged".   Diabetes- Is not checking blood sugars. Denies issues with medications. No concerns about this today.  HTN- takes daily medication. Does not check his BP at home.   Denies cardiac history and no known family history of cardiac disease.   Last CPE: 2 years ago  Other providers: none  He is still smoking approximately one pack per day, drinks alcohol daily, denies drug use Diet: states he is eating 2 times per day, not snacking.  Exercise: nothing outside of work.   He is sexually  active and would like STD testing.   Immunizations: flu shot- refuses. Tetanus- thinks this was in past 5 years. Pneumonia shot 2 years ago at [employer] former employer.   Health maintenance:  Colonoscopy: had one in his 58s.  Last PSA: never Last Dental Exam: years.  Last Eye Exam: 2 years  Wears seatbelt always, uses sunscreen, smoke detectors in home and functioning, does not text while driving, feels safe in home environment.  Reviewed allergies, medications, past medical, surgical, family, and social history.   Review of Systems Review of Systems Constitutional: -fever, -chills, -sweats, -unexpected weight change,-fatigue ENT: -runny nose, -ear pain, -sore throat Cardiology:  -chest pain, -palpitations, -edema Respiratory: -cough, -shortness of breath, -wheezing Gastroenterology: -abdominal pain, -nausea, -vomiting, -diarrhea, -constipation  Hematology: -bleeding or bruising problems Musculoskeletal: -arthralgias, -myalgias, -joint swelling, + right low back pain Ophthalmology: -vision changes Urology: -dysuria, -difficulty urinating, -hematuria, -urinary frequency, -urgency Neurology: -headache, -weakness, -tingling, -numbness       Objective:   Physical Exam BP 122/80   Pulse 64   Ht 6' 1.25" (1.861 m)   Wt 220 lb (99.8 kg)   BMI 28.83 kg/m   General Appearance:    Alert, cooperative, no distress, appears stated age  Head:    Normocephalic, without obvious abnormality, atraumatic  Eyes:    PERRL, conjunctiva/corneas clear, EOM's intact,  fundi    benign  Ears:    Normal TM's and external ear canals  Nose:   Nares normal, mucosa normal, no drainage or sinus   tenderness  Throat:   Lips, mucosa, and tongue normal; teeth and gums normal  Neck:   Supple, no lymphadenopathy;  thyroid:  no   enlargement/tenderness/nodules; no carotid   bruit or JVD  Back:    Spine nontender, no curvature, ROM normal, no CVA     tenderness  Lungs:     Clear to auscultation bilaterally  without wheezes, rales or     ronchi; respirations unlabored  Chest Wall:    No tenderness or deformity   Heart:    Regular rate and rhythm, S1 and S2 normal, no murmur, rub   or gallop  Breast Exam:    No chest wall tenderness, masses or gynecomastia  Abdomen:     Soft, non-tender, nondistended, normoactive bowel sounds,    no masses, no hepatosplenomegaly  Genitalia:    Refused.   Rectal:    Refused.  Extremities:   No clubbing, cyanosis or edema  Pulses:   2+ and symmetric all extremities  Skin:   Skin color, texture, turgor normal, no rashes or lesions  Lymph nodes:   Cervical, supraclavicular, and axillary nodes normal  Neurologic:   CNII-XII intact, normal strength, sensation and gait; reflexes 2+ and symmetric throughout          Psych:   Normal mood, affect, hygiene and grooming.     Urinalysis dipstick: negative  Hemoglobin A1C 7.0%  ECG done for screening. NSR. Rate 65. No old ones. No acute changes.   Epworth sleep scale: 8     Assessment & Plan:  Routine general medical examination at a health care facility - Plan: POCT urinalysis dipstick, CBC with Differential/Platelet, COMPLETE METABOLIC PANEL WITH GFR, Lipid panel, TSH  Smoker  Alcohol abuse, daily use  Screening for STD (sexually transmitted disease) - Plan: GC/Chlamydia Probe Amp, RPR, HIV antibody  Special screening for malignant neoplasms, colon - Plan: Ambulatory referral to Gastroenterology  Screening for ischemic heart disease - Plan: EKG 12-Lead  Screening for prostate cancer - Plan: PSA  Controlled type 2 diabetes mellitus without complication, without long-term current use of insulin ([place]) - Plan: Microalbumin / creatinine urine ratio, POCT glycosylated hemoglobin (Hb A1C), TSH  Essential hypertension  Sleep disorder  Acute URI  Right-sided low back pain without sciatica, unspecified chronicity  Hemoglobin A1C 6.4% in July 2017. Today it is 7.0%. Counseled on diet, exercise and medication  compliance. He is aware he needs to have annual eye exam and will schedule this.  microalbumin checked today.  HTN- controlled and he will stay on current medication.   Counseled on good sleep hygiene. He may try melatonin. Does not appear to have sleep apnea, Epworth sleep scale does not indicate further testing warranted.  Smoking cessation, he does not appear ready to stop.   Daily alcohol use, counseled on increased risk of chronic health conditions and discussed cutting back.   Right flank pain appears to be intermittent and could not reproduce pain during exam. Recommend using heat and oral NSAIDS vs topical analgesics. He will follow up if not improving.  He appears to an acute URI with a viral etiology and will follow up if he worsens.   STD testing performed.  Referral made to GI for colonoscopy.  Plan to follow up pending labs or in 3 months for diabetes and HTN.  He  may need further counseling on sleep.

## 2016-02-05 LAB — HIV ANTIBODY (ROUTINE TESTING W REFLEX): HIV: NONREACTIVE

## 2016-02-05 LAB — RPR

## 2016-02-05 LAB — MICROALBUMIN / CREATININE URINE RATIO
CREATININE, URINE: 71 mg/dL (ref 20–370)
Microalb Creat Ratio: 3 mcg/mg creat (ref <30)
Microalb, Ur: 0.2 mg/dL

## 2016-02-05 LAB — GC/CHLAMYDIA PROBE AMP
CT Probe RNA: NOT DETECTED
GC PROBE AMP APTIMA: NOT DETECTED

## 2016-02-11 ENCOUNTER — Telehealth: Payer: Self-pay | Admitting: Family Medicine

## 2016-02-11 DIAGNOSIS — I10 Essential (primary) hypertension: Secondary | ICD-10-CM

## 2016-02-11 DIAGNOSIS — IMO0001 Reserved for inherently not codable concepts without codable children: Secondary | ICD-10-CM

## 2016-02-11 DIAGNOSIS — E119 Type 2 diabetes mellitus without complications: Secondary | ICD-10-CM

## 2016-02-11 DIAGNOSIS — E1165 Type 2 diabetes mellitus with hyperglycemia: Secondary | ICD-10-CM

## 2016-02-11 MED ORDER — GLYBURIDE 1.25 MG PO TABS: 1.2500 mg | ORAL_TABLET | Freq: Every day | ORAL | 1 refills | Status: DC

## 2016-02-11 MED ORDER — LISINOPRIL-HYDROCHLOROTHIAZIDE 10-12.5 MG PO TABS: 1.0000 | ORAL_TABLET | Freq: Every day | ORAL | 1 refills | Status: DC

## 2016-02-11 MED ORDER — METFORMIN HCL 1000 MG PO TABS: 1000.0000 mg | ORAL_TABLET | Freq: Every day | ORAL | 1 refills | Status: DC

## 2016-02-11 NOTE — Telephone Encounter (Signed)
Ok to refill his medications for 6 months. Please ask him if he is having issues with his vision since he stated he could not see his prescription bottles or if this is more of a reading issue.

## 2016-02-11 NOTE — Telephone Encounter (Signed)
Tried to call patient but phone was unable to accept incoming calls. I have refilled med for 6 months

## 2016-02-11 NOTE — Telephone Encounter (Signed)
Left message for pt to call me back 

## 2016-02-11 NOTE — Telephone Encounter (Signed)
Pt called and advised he did not have any refills.  I asked him on what med and he said he could not see very well, so he did not know.  I reviewed meds and he has 1 refill left on his Glyburide, Lisinopril and Metformin.  So I called pharmacy and advised to please get them ready for him.  I had to leave on their ans machine, as no one would answer.  Do you want to add additional refills on these meds for future for this patient since he just had CPE. Also be aware pt cannot see or possibly cannot read.

## 2016-02-12 NOTE — Telephone Encounter (Signed)
Refilled meds to pharmacy. Pt has a reading disability and can see the bottle far away but can not read what it says

## 2016-03-21 ENCOUNTER — Encounter: Payer: Self-pay | Admitting: Family Medicine

## 2016-03-21 ENCOUNTER — Ambulatory Visit (INDEPENDENT_AMBULATORY_CARE_PROVIDER_SITE_OTHER): Payer: No Typology Code available for payment source | Admitting: Family Medicine

## 2016-03-21 VITALS — BP 124/80 | HR 78 | Temp 98.0°F | Resp 16 | Wt 226.0 lb

## 2016-03-21 DIAGNOSIS — R05 Cough: Secondary | ICD-10-CM

## 2016-03-21 DIAGNOSIS — J069 Acute upper respiratory infection, unspecified: Secondary | ICD-10-CM

## 2016-03-21 DIAGNOSIS — R059 Cough, unspecified: Secondary | ICD-10-CM

## 2016-03-21 MED ORDER — AZITHROMYCIN 250 MG PO TABS: ORAL_TABLET | ORAL | 0 refills | Status: DC

## 2016-03-21 MED ORDER — BENZONATATE 200 MG PO CAPS: 200.0000 mg | ORAL_CAPSULE | Freq: Two times a day (BID) | ORAL | 0 refills | Status: DC | PRN

## 2016-03-21 NOTE — Progress Notes (Signed)
Subjective: Chief Complaint  Patient presents with  . sick    cough, chest congestion, low grade fever, hoarsness, headache. been going on for couple weeks     [name] is a 60 y.o. male who presents for a 2 week history of hoarse voice, and cough that started out as a dry cough but has worsened to a productive cough that occasionally makes him gag. Initially he had nasal congestion, sinus pain, low grade fever, body aches but states these symptoms have improved.  He is a daily smoker.  No recent antibiotics.   Denies ear pain, chest pain, palpitations, shortness of breath, wheezing, abdominal pain, N/V/D.   Treatment to date: cough suppressants and decongestants.  Positive sick contacts.  No other aggravating or relieving factors.  No other c/o.  ROS as in subjective.   Objective: Vitals:   03/21/16 0956  BP: 124/80  Pulse: 78  Resp: 16  Temp: 98 F (36.7 C)    General appearance: Alert, WD/WN, no distress, mildly ill appearing                             Skin: warm, no rash                           Head: no sinus tenderness                            Eyes: conjunctiva normal, corneas clear, PERRLA                            Ears: pearly TMs, external ear canals normal                          Nose: septum midline, turbinates swollen, with erythema and clear discharge             Mouth/throat: MMM, tongue normal, mild pharyngeal erythema                           Neck: supple, no adenopathy, no thyromegaly, nontender                          Heart: RRR, normal S1, S2, no murmurs                         Lungs: mild expiratory wheezes to LUL otherwise clear, no rales, or rhonchi      Assessment: Acute URI  Cough   Plan: Discussed diagnosis and treatment of URI and cough. Z-pak prescribed and Tessalon for cough.   Suggested symptomatic OTC remedies. Nasal saline spray for congestion.  Tylenol or Ibuprofen OTC for fever and malaise.  Call/return if not back to  baseline in 10 days or if he worsens.

## 2016-03-31 ENCOUNTER — Ambulatory Visit (AMBULATORY_SURGERY_CENTER): Payer: Self-pay | Admitting: *Deleted

## 2016-03-31 VITALS — Ht 74.0 in | Wt 219.6 lb

## 2016-03-31 DIAGNOSIS — Z1211 Encounter for screening for malignant neoplasm of colon: Secondary | ICD-10-CM

## 2016-03-31 MED ORDER — NA SULFATE-K SULFATE-MG SULF 17.5-3.13-1.6 GM/177ML PO SOLN
ORAL | 0 refills | Status: DC
Start: 2016-03-31 — End: 2016-04-14

## 2016-03-31 NOTE — Progress Notes (Deleted)
Pt denies allergies to eggs or soy products. Denies difficulty with sedation or anesthesia. Denies any diet or weight loss medications. Denies use of supplemental oxygen.  Emmi instructions given for procedure.  

## 2016-03-31 NOTE — Progress Notes (Addendum)
Pt denies allergies to eggs or soy products. Denies difficulty with sedation or anesthesia. Denies any diet or weight loss medications. Denies use of supplemental oxygen.  Emmi instructions not given; no access to internet.

## 2016-04-14 ENCOUNTER — Ambulatory Visit (AMBULATORY_SURGERY_CENTER): Payer: No Typology Code available for payment source | Admitting: Gastroenterology

## 2016-04-14 ENCOUNTER — Encounter: Payer: Self-pay | Admitting: Gastroenterology

## 2016-04-14 VITALS — BP 121/71 | HR 68 | Temp 98.0°F | Resp 21 | Ht 74.0 in | Wt 219.0 lb

## 2016-04-14 DIAGNOSIS — D124 Benign neoplasm of descending colon: Secondary | ICD-10-CM

## 2016-04-14 DIAGNOSIS — Z1212 Encounter for screening for malignant neoplasm of rectum: Secondary | ICD-10-CM

## 2016-04-14 DIAGNOSIS — Z1211 Encounter for screening for malignant neoplasm of colon: Secondary | ICD-10-CM | POA: Diagnosis present

## 2016-04-14 MED ORDER — SODIUM CHLORIDE 0.9 % IV SOLN: 500.0000 mL | INTRAVENOUS | Status: DC

## 2016-04-14 NOTE — Progress Notes (Signed)
Report to PACU, RN, vss, BBS= Clear.  

## 2016-04-14 NOTE — Progress Notes (Signed)
Called to room to assist during endoscopic procedure.  Patient ID and intended procedure confirmed with present staff. Received instructions for my participation in the procedure from the performing physician.  

## 2016-04-14 NOTE — Patient Instructions (Signed)
YOU HAD AN ENDOSCOPIC PROCEDURE TODAY AT THE [facility]:   Refer to the procedure report that was given to you for any specific questions about what was found during the examination.  If the procedure report does not answer your questions, please call your gastroenterologist to clarify.  If you requested that your care partner not be given the details of your procedure findings, then the procedure report has been included in a sealed envelope for you to review at your convenience later.  YOU SHOULD EXPECT: Some feelings of bloating in the abdomen. Passage of more gas than usual.  Walking can help get rid of the air that was put into your GI tract during the procedure and reduce the bloating. If you had a lower endoscopy (such as a colonoscopy or flexible sigmoidoscopy) you may notice spotting of blood in your stool or on the toilet paper. If you underwent a bowel prep for your procedure, you may not have a normal bowel movement for a few days.  Please Note:  You might notice some irritation and congestion in your nose or some drainage.  This is from the oxygen used during your procedure.  There is no need for concern and it should clear up in a day or so.  SYMPTOMS TO REPORT IMMEDIATELY:   Following lower endoscopy (colonoscopy or flexible sigmoidoscopy):  Excessive amounts of blood in the stool  Significant tenderness or worsening of abdominal pains  Swelling of the abdomen that is new, acute  Fever of 100F or higher   For urgent or emergent issues, a gastroenterologist can be reached at any hour by calling [phone].   DIET:  We do recommend a small meal at first, but then you may proceed to your regular diet.  Drink plenty of fluids but you should avoid alcoholic beverages for 24 hours. Try to increase the fiber in your diet, and drink plenty of water.  ACTIVITY:  You should plan to take it easy for the rest of today and you should NOT DRIVE or use heavy machinery until  tomorrow (because of the sedation medicines used during the test).    FOLLOW UP: Our staff will call the number listed on your records the next business day following your procedure to check on you and address any questions or concerns that you may have regarding the information given to you following your procedure. If we do not reach you, we will leave a message.  However, if you are feeling well and you are not experiencing any problems, there is no need to return our call.  We will assume that you have returned to your regular daily activities without incident.  If any biopsies were taken you will be contacted by phone or by letter within the next 1-3 weeks.  Please call us at [phone] if you have not heard about the biopsies in 3 weeks.    SIGNATURES/CONFIDENTIALITY: You and/or your care partner have signed paperwork which will be entered into your electronic medical record.  These signatures attest to the fact that that the information above on your After Visit Summary has been reviewed and is understood.  Full responsibility of the confidentiality of this discharge information lies with you and/or your care-partner.  Read all of the handouts given to you by your recovery room nurse today. 

## 2016-04-14 NOTE — Op Note (Signed)
[facility] Patient Name: Terelle Fondren Procedure Date: 04/14/2016 7:54 AM MRN: [id number] Endoscopist: Milus Banister , MD Age: 61 Referring MD:  Date of Birth: [birth year] Gender: Male Account #: [id number] Procedure:                Colonoscopy Indications:              Screening for colorectal malignant neoplasm Medicines:                Monitored Anesthesia Care Procedure:                Pre-Anesthesia Assessment:                           - Prior to the procedure, a History and Physical                            was performed, and patient medications and                            allergies were reviewed. The patient's tolerance of                            previous anesthesia was also reviewed. The risks                            and benefits of the procedure and the sedation                            options and risks were discussed with the patient.                            All questions were answered, and informed consent                            was obtained. Prior Anticoagulants: The patient has                            taken no previous anticoagulant or antiplatelet                            agents. ASA Grade Assessment: II - A patient with                            mild systemic disease. After reviewing the risks                            and benefits, the patient was deemed in                            satisfactory condition to undergo the procedure.                           After obtaining informed consent, the colonoscope  was passed under direct vision. Throughout the                            procedure, the patient's blood pressure, pulse, and                            oxygen saturations were monitored continuously. The                            Model CF-HQ190L [id number]) scope was introduced                            through the anus and advanced to the the cecum,                            identified by  appendiceal orifice and ileocecal                            valve. The colonoscopy was performed without                            difficulty. The patient tolerated the procedure                            well. The quality of the bowel preparation was                            good. The ileocecal valve, appendiceal orifice, and                            rectum were photographed. Scope In: 7:59:36 AM Scope Out: 8:09:37 AM Scope Withdrawal Time: 0 hours 8 minutes 4 seconds  Total Procedure Duration: 0 hours 10 minutes 1 second  Findings:                 A 2 mm polyp was found in the descending colon. The                            polyp was sessile. The polyp was removed with a                            cold snare. Resection was complete, but the polyp                            tissue was not retrieved.                           A few small-mouthed diverticula were found in the                            entire colon.                           The exam was otherwise without abnormality on  direct and retroflexion views. Complications:            No immediate complications. Estimated blood loss:                            None. Estimated Blood Loss:     Estimated blood loss: none. Impression:               - One 2 mm polyp in the descending colon, removed                            with a cold snare. Complete resection. Polyp tissue                            not retrieved.                           - Diverticulosis in the entire examined colon.                           - The examination was otherwise normal on direct                            and retroflexion views. Recommendation:           - Patient has a contact number available for                            emergencies. The signs and symptoms of potential                            delayed complications were discussed with the                            patient. Return to normal activities tomorrow.                             Written discharge instructions were provided to the                            patient.                           - Resume previous diet.                           - Continue present medications.                           - Repeat colonoscopy in 10 years for screening                            purposes. Milus Banister, MD 04/14/2016 8:12:45 AM This report has been signed electronically.

## 2016-04-15 ENCOUNTER — Telehealth: Payer: Self-pay | Admitting: *Deleted

## 2016-04-15 NOTE — Telephone Encounter (Signed)
  Follow up Call-  Call back number 04/14/2016  Post procedure Call Back phone  # [phone]  Permission to leave phone message Yes  Some recent data might be hidden     Patient questions:  Do you have a fever, pain , or abdominal swelling? No. Pain Score  0 *  Have you tolerated food without any problems? Yes.    Have you been able to return to your normal activities? Yes.    Do you have any questions about your discharge instructions: Diet   No. Medications  No. Follow up visit  No.  Do you have questions or concerns about your Care? No.  Actions: * If pain score is 4 or above: No action needed, pain <4.

## 2016-06-26 ENCOUNTER — Telehealth: Payer: Self-pay

## 2016-06-26 DIAGNOSIS — I10 Essential (primary) hypertension: Secondary | ICD-10-CM

## 2016-06-26 MED ORDER — LISINOPRIL-HYDROCHLOROTHIAZIDE 10-12.5 MG PO TABS: 1.0000 | ORAL_TABLET | Freq: Every day | ORAL | 1 refills | Status: DC

## 2016-06-26 NOTE — Telephone Encounter (Signed)
Fax request for lisinopril/hctz #90 to [facility] on [address]. Victor[place] December

## 2016-06-26 NOTE — Telephone Encounter (Signed)
done

## 2016-08-04 ENCOUNTER — Encounter: Payer: Self-pay | Admitting: Family Medicine

## 2016-08-04 ENCOUNTER — Ambulatory Visit (INDEPENDENT_AMBULATORY_CARE_PROVIDER_SITE_OTHER): Payer: No Typology Code available for payment source | Admitting: Family Medicine

## 2016-08-04 VITALS — BP 128/78 | HR 86 | Wt 217.8 lb

## 2016-08-04 DIAGNOSIS — I10 Essential (primary) hypertension: Secondary | ICD-10-CM

## 2016-08-04 DIAGNOSIS — E1165 Type 2 diabetes mellitus with hyperglycemia: Secondary | ICD-10-CM | POA: Diagnosis not present

## 2016-08-04 DIAGNOSIS — F17200 Nicotine dependence, unspecified, uncomplicated: Secondary | ICD-10-CM | POA: Diagnosis not present

## 2016-08-04 DIAGNOSIS — M545 Low back pain: Secondary | ICD-10-CM

## 2016-08-04 DIAGNOSIS — G8929 Other chronic pain: Secondary | ICD-10-CM

## 2016-08-04 DIAGNOSIS — IMO0001 Reserved for inherently not codable concepts without codable children: Secondary | ICD-10-CM

## 2016-08-04 LAB — POCT GLYCOSYLATED HEMOGLOBIN (HGB A1C)

## 2016-08-04 MED ORDER — IBUPROFEN 800 MG PO TABS: 800.0000 mg | ORAL_TABLET | Freq: Three times a day (TID) | ORAL | 0 refills | Status: DC | PRN

## 2016-08-04 NOTE — Progress Notes (Signed)
Subjective:    Patient ID: [name], male    DOB: 08-09-1955, 61 y.o.   MRN: [id number]  [name] is a 61 y.o. male who presents for follow-up of Type 2 diabetes mellitus.  He would also like to discuss back pain. States he has had low back and hip pain for at least 4-5 years. Pain is non radiating.  States he has been worked up for this and told her has arthritis in his hips but his back was fine. States pain is only present when he is working and lifting heavy things. States he is taking Advil when needed with working and this helps his pain. States he does not have pain when he is not at work. Does not want to pursue further testing or treatment today.  Denies fever, chills, unexplained weight loss, fatigue, urinary or bowel symptoms.  Denies abdominal pain, N/V/D. No numbness, tingling or weakness.    Last Hgb A1c 7.0%   Patient is checking home blood sugars.   Home blood sugar records: 101-129 How often is blood sugars being checked: 1-2 times per week.  Current symptoms include: none. Patient denies foot ulcerations, hyperglycemia, hypoglycemia , increased appetite, nausea, paresthesia of the feet, polydipsia, polyuria and visual disturbances.  Patient is checking their feet daily. Any Foot concerns (callous, ulcer, wound, thickened nails, toenail fungus, skin fungus, hammer toe): none Last dilated eye exam: 2 months. No retinopathy.   Current treatments: Continued metformin which has been effective. Medication compliance: excellent  Current diet: in general, a "healthy" diet   Current exercise: no regular exercise Known diabetic complications: none  The following portions of the patient's history were reviewed and updated as appropriate: allergies, current medications, past medical history, past social history and problem list.  ROS as in subjective above.     Objective:    Physical Exam Alert and in no distress.  Pharyngeal area is normal. Neck is supple  without adenopathy or thyromegaly. Cardiac exam shows a regular sinus rhythm without murmurs or gallops. Lungs are clear to auscultation.  Abdomen soft, nontender, normal BS, no hepatosplenomegaly. Thoracic back normal. Lumbar spine normal alignment, no step off, nontender, normal ROM. Bilateral hips normal exam. Negative straight leg raise.  Extremities without edema, normal pulses. DTRs normal and symmetric.     Blood pressure 128/78, pulse 86, weight 217 lb 12.8 oz (98.8 kg).  Lab Review Diabetic Labs Latest Ref Rng & Units 08/04/2016 02/04/2016 10/03/2015 06/18/2015 02/26/2015  HbA1c - 6.7% 7.0% 6.4% 7.3% -  Microalbumin Not estab mg/dL - 0.2 - - -  Micro/Creat Ratio <30 mcg/mg creat - 3 - - -  Chol <200 mg/dL - 169 - - -  HDL >40 mg/dL - 66 - - -  Calc LDL <100 mg/dL - 89 - - -  Triglycerides <150 mg/dL - 71 - - -  Creatinine 0.70 - 1.33 mg/dL - 0.92 - - 0.90   BP/Weight 08/04/2016 04/14/2016 03/31/2016 03/21/2016 83/41/9622  Systolic BP 297 989 - 211 941  Diastolic BP 78 71 - 80 80  Wt. (Lbs) 217.8 219 219.6 226 220  BMI 27.96 28.12 28.19 29.61 28.83   Foot/eye exam completion dates Latest Ref Rng & Units 10/03/2015 07/13/2014  Eye Exam No Retinopathy - No Retinopathy  Foot Form Completion - Done -    [name]  reports that he has been smoking Cigarettes.  He has been smoking about 1.00 pack per day. He has never used smokeless tobacco. He reports that he drinks alcohol.  He reports that he does not use drugs.     Assessment & Plan:    Uncontrolled type 2 diabetes mellitus without complication, without long-term current use of insulin ([facility]CC) - Plan: HgB A1c  Essential hypertension  Smoker  Chronic bilateral low back pain without sciatica - Plan: ibuprofen (ADVIL,MOTRIN) 800 MG tablet  1. Rx changes: none Hgb A1c 6.7% diabetes well controlled.  2. Education: Reviewed 'ABCs' of diabetes management (respective goals in parentheses):  A1C (<7), blood pressure (<130/80), and  cholesterol (LDL <100). 3. Compliance at present is estimated to be good. Efforts to improve compliance (if necessary) will be directed at dietary modifications: cut back on sugar, carbohydrates and alcohol and increased exercise. 4. Discussed multiple treatment options for low back pain. He declines XR, PT, referral to ortho. States pain is manageable with NSAIDS. Discussed trying topical analgesics. He will let me know if he decides to pursue further evaluation and treatment options.  5. He is not ready to stop smoking.  6. BP is within goal range. Continue current medications.  7. Follow up: 6 months

## 2016-08-11 ENCOUNTER — Telehealth: Payer: Self-pay | Admitting: Family Medicine

## 2016-08-14 NOTE — Telephone Encounter (Signed)
err

## 2016-09-28 ENCOUNTER — Other Ambulatory Visit: Payer: Self-pay | Admitting: Family Medicine

## 2016-09-28 DIAGNOSIS — E119 Type 2 diabetes mellitus without complications: Secondary | ICD-10-CM

## 2016-10-23 ENCOUNTER — Other Ambulatory Visit: Payer: Self-pay | Admitting: Podiatry

## 2016-11-11 ENCOUNTER — Other Ambulatory Visit: Payer: Self-pay | Admitting: Family Medicine

## 2016-11-11 DIAGNOSIS — E1165 Type 2 diabetes mellitus with hyperglycemia: Principal | ICD-10-CM

## 2016-11-11 DIAGNOSIS — E119 Type 2 diabetes mellitus without complications: Secondary | ICD-10-CM

## 2016-11-11 DIAGNOSIS — IMO0001 Reserved for inherently not codable concepts without codable children: Secondary | ICD-10-CM

## 2016-11-11 DIAGNOSIS — I10 Essential (primary) hypertension: Secondary | ICD-10-CM

## 2017-02-05 ENCOUNTER — Telehealth: Payer: Self-pay | Admitting: Family Medicine

## 2017-02-05 DIAGNOSIS — IMO0001 Reserved for inherently not codable concepts without codable children: Secondary | ICD-10-CM

## 2017-02-05 DIAGNOSIS — E119 Type 2 diabetes mellitus without complications: Secondary | ICD-10-CM

## 2017-02-05 DIAGNOSIS — E1165 Type 2 diabetes mellitus with hyperglycemia: Secondary | ICD-10-CM

## 2017-02-05 DIAGNOSIS — I10 Essential (primary) hypertension: Secondary | ICD-10-CM

## 2017-02-05 NOTE — Telephone Encounter (Signed)
Pt called and cancelled his CPE that was scheduled on 02/09/17. He stated that he will not be able to get off work and he is not sure when he will be able to get off work right now to resched but pt requesting 90 day refills on all his med.

## 2017-02-06 MED ORDER — LISINOPRIL-HYDROCHLOROTHIAZIDE 10-12.5 MG PO TABS: 1.0000 | ORAL_TABLET | Freq: Every day | ORAL | 0 refills | Status: DC

## 2017-02-06 MED ORDER — METFORMIN HCL 1000 MG PO TABS: 1000.0000 mg | ORAL_TABLET | Freq: Every day | ORAL | 0 refills | Status: DC

## 2017-02-06 MED ORDER — GLYBURIDE 1.25 MG PO TABS: 1.2500 mg | ORAL_TABLET | Freq: Every day | ORAL | 0 refills | Status: DC

## 2017-02-06 NOTE — Telephone Encounter (Signed)
Pt has not been in since may for med check and DM. He cancelled his appt and has not rescheduled and wants 90 days. Is this okay to refill on DM and bp med?

## 2017-02-06 NOTE — Telephone Encounter (Signed)
As long as he schedules to be seen in the next 1-2 months. Let him know we cannot refill again without a visit.

## 2017-02-06 NOTE — Telephone Encounter (Signed)
Pt was notified that I have sent bp med and DM meds to pharmacy for 3 months but pt must have an appt before he runs out. Pt will call back

## 2017-02-09 ENCOUNTER — Encounter: Payer: Managed Care, Other (non HMO) | Admitting: Family Medicine

## 2017-05-06 ENCOUNTER — Encounter: Payer: Self-pay | Admitting: Family Medicine

## 2017-05-06 ENCOUNTER — Ambulatory Visit: Payer: BLUE CROSS/BLUE SHIELD | Admitting: Family Medicine

## 2017-05-06 ENCOUNTER — Ambulatory Visit
Admission: RE | Admit: 2017-05-06 | Discharge: 2017-05-06 | Disposition: A | Discharge status: Home / Self Care | Payer: BLUE CROSS/BLUE SHIELD | Source: Ambulatory Visit | Attending: Family Medicine | Admitting: Family Medicine

## 2017-05-06 VITALS — BP 130/72 | HR 75 | Wt 223.0 lb

## 2017-05-06 DIAGNOSIS — M25561 Pain in right knee: Secondary | ICD-10-CM | POA: Diagnosis not present

## 2017-05-06 DIAGNOSIS — M79644 Pain in right finger(s): Secondary | ICD-10-CM | POA: Diagnosis not present

## 2017-05-06 MED ORDER — DICLOFENAC SODIUM 75 MG PO TBEC: 75.0000 mg | DELAYED_RELEASE_TABLET | Freq: Two times a day (BID) | ORAL | 0 refills | Status: DC

## 2017-05-06 NOTE — Progress Notes (Signed)
   Subjective:    Patient ID: [name], male    DOB: [birth year], 62 y.o.   MRN: [id number]  HPI Chief Complaint  Patient presents with  . knee pain    right knee pain- month ago or more and finger pain   He is here with complaints of right knee pain and finger pain.   Complains of a 2 week history of intermittent sharp pain in his right index finger only when it is stretched out. Denies injury. No numbness, tingling or weakness.  States she has been taking Advil and BC for this pain without any relief.   Also complains of a 5-6 week history of right knee pain. Posterior and medial pain with walking up and down steps or by pressing down on the gas pedal. Pain is relieved with rest.  Denies injury, surgery or previous pain in right knee.   Denies fever, chills, dizziness, chest pain, palpitations, shortness of breath, abdominal pain, N/V/D, urinary symptoms, LE edema.   Reviewed allergies, medications, past medical, surgical, family, and social history.     Review of Systems Pertinent positives and negatives in the history of present illness.     Objective:   Physical Exam BP 130/72   Pulse 75   Wt 223 lb (101.2 kg)   BMI 28.63 kg/m   Right index finger without edema, erythema, non tender. Normal sensation, cap refill, ROM, and strength. Unable to reproduce pain.  Right knee -TTP in medial superior joint line, mild medial effusion, normal patella, no erythema, laxity, locking or popping. Negative Mcmurrays.  RLE is neurovascularly intact.       Assessment & Plan:  Acute pain of right knee - Plan: DG Knee Complete 4 Views Right, diclofenac (VOLTAREN) 75 MG EC tablet, DISCONTINUED: diclofenac (VOLTAREN) 75 MG EC tablet  Pain of finger of right hand - Plan: diclofenac (VOLTAREN) 75 MG EC tablet, DISCONTINUED: diclofenac (VOLTAREN) 75 MG EC tablet  Plan to send him for XR of right knee. No infectious process or mechanical issues. Discussed conservative treatment of  finger and knee.  He will stop Advil and BC and try diclofenac.  Pending XR result, he may need referral to orthopedist or PT.

## 2017-06-01 ENCOUNTER — Telehealth: Payer: Self-pay | Admitting: Family Medicine

## 2017-06-01 NOTE — Telephone Encounter (Signed)
Pt states they started last night around 8pm. He has tried taking mustard but no relief. He feels he's well hydrated. Please advise what to do next

## 2017-06-01 NOTE — Telephone Encounter (Signed)
Please advise 

## 2017-06-01 NOTE — Telephone Encounter (Signed)
What does he take for cramps? Is he dehydrated? He may need his electrolytes checked if this continue being an issue.

## 2017-06-01 NOTE — Telephone Encounter (Signed)
New Message  Pt verbalized he is needing rx for his cramps they are so bad he could not go to work. Both of his leg and his thighs. Pt verbalized he had to call out of work.  Please f/u with pt

## 2017-06-01 NOTE — Telephone Encounter (Signed)
He can try doing stretches, using heat or ice and trying ibuprofen. He should really be seen and have some blood work done because often a low potassium level can cause cramping and the only way to check this is to get blood work.

## 2017-06-01 NOTE — Telephone Encounter (Signed)
Pt is coming in tomorrow as there were not appts left for today and vickie is off today. I advised pt to try doing vickie's recommendations but pt states he can barely walk. Pt will call back tomorrow if needs to cancel

## 2017-06-02 ENCOUNTER — Encounter: Payer: Self-pay | Admitting: Family Medicine

## 2017-06-02 ENCOUNTER — Ambulatory Visit (INDEPENDENT_AMBULATORY_CARE_PROVIDER_SITE_OTHER): Payer: BLUE CROSS/BLUE SHIELD | Admitting: Family Medicine

## 2017-06-02 VITALS — BP 124/80 | HR 74 | Wt 225.4 lb

## 2017-06-02 DIAGNOSIS — E1159 Type 2 diabetes mellitus with other circulatory complications: Secondary | ICD-10-CM

## 2017-06-02 DIAGNOSIS — E1165 Type 2 diabetes mellitus with hyperglycemia: Secondary | ICD-10-CM

## 2017-06-02 DIAGNOSIS — E119 Type 2 diabetes mellitus without complications: Secondary | ICD-10-CM

## 2017-06-02 DIAGNOSIS — R252 Cramp and spasm: Secondary | ICD-10-CM | POA: Diagnosis not present

## 2017-06-02 DIAGNOSIS — I152 Hypertension secondary to endocrine disorders: Secondary | ICD-10-CM

## 2017-06-02 DIAGNOSIS — IMO0001 Reserved for inherently not codable concepts without codable children: Secondary | ICD-10-CM

## 2017-06-02 DIAGNOSIS — I10 Essential (primary) hypertension: Secondary | ICD-10-CM | POA: Diagnosis not present

## 2017-06-02 LAB — POCT GLYCOSYLATED HEMOGLOBIN (HGB A1C)

## 2017-06-02 MED ORDER — CYCLOBENZAPRINE HCL 10 MG PO TABS: 10.0000 mg | ORAL_TABLET | Freq: Three times a day (TID) | ORAL | 0 refills | Status: DC | PRN

## 2017-06-02 MED ORDER — LISINOPRIL-HYDROCHLOROTHIAZIDE 10-12.5 MG PO TABS: 1.0000 | ORAL_TABLET | Freq: Every day | ORAL | 0 refills | Status: DC

## 2017-06-02 MED ORDER — GLYBURIDE 1.25 MG PO TABS: 1.2500 mg | ORAL_TABLET | Freq: Every day | ORAL | 0 refills | Status: DC

## 2017-06-02 MED ORDER — METFORMIN HCL 1000 MG PO TABS: 1000.0000 mg | ORAL_TABLET | Freq: Every day | ORAL | 1 refills | Status: DC

## 2017-06-02 NOTE — Progress Notes (Signed)
   Subjective:    Patient ID: [name], male    DOB: 10/25/1955, 62 y.o.   MRN: [id number]  HPI He is here for evaluation of muscle cramps.  He states that they are bilateral and have woken him up.  He states that he has not had any recent illnesses or increase in his workload.  He then stated that he has had intermittent difficulty with this over the last several years.  Review of the record indicates he is a diabetic but has not been checked in quite some time.  He states that his last blood sugar was in the 100s.  He does check his feet regularly and states he has been seen by the ophthalmologist although there is not a good record of that.  Medications were reviewed.  Social and family history was also reviewed.   Review of Systems     Objective:   Physical Exam Alert and in no distress.  DTRs were 1+ tympanic membranes and canals are normal. Pharyngeal area is normal. Neck is supple without adenopathy or thyromegaly. Cardiac exam shows a regular sinus rhythm without murmurs or gallops. Lungs are clear to auscultation. Hemoglobin A1c is 7.2       Assessment & Plan:  Controlled type 2 diabetes mellitus without complication, without long-term current use of insulin ([place]) - Plan: CBC with Differential/Platelet, Comprehensive metabolic panel, Lipid panel, HgB A1c  Hypertension associated with diabetes ([place]) - Plan: CBC with Differential/Platelet, Comprehensive metabolic panel  Muscle cramps - Plan: cyclobenzaprine (FLEXERIL) 10 MG tablet At this point he seems to be doing well but no good reason for the cramping.  I explained this to him.  I will give him Flexeril and monitor this.  He was comfortable with that.  We will also have him return in several months for follow-up on his diabetes.  Presently he is not on a statin.

## 2017-06-03 LAB — CBC WITH DIFFERENTIAL/PLATELET
Basophils Absolute: 0 10*3/uL (ref 0.0–0.2)
Basos: 0 %
EOS (ABSOLUTE): 0 10*3/uL (ref 0.0–0.4)
EOS: 1 %
HEMATOCRIT: 44.4 % (ref 37.5–51.0)
HEMOGLOBIN: 14.7 g/dL (ref 13.0–17.7)
Immature Grans (Abs): 0 10*3/uL (ref 0.0–0.1)
Immature Granulocytes: 0 %
LYMPHS ABS: 3 10*3/uL (ref 0.7–3.1)
Lymphs: 59 %
MCH: 29.9 pg (ref 26.6–33.0)
MCHC: 33.1 g/dL (ref 31.5–35.7)
MCV: 90 fL (ref 79–97)
MONOCYTES: 7 %
MONOS ABS: 0.3 10*3/uL (ref 0.1–0.9)
NEUTROS ABS: 1.6 10*3/uL (ref 1.4–7.0)
Neutrophils: 33 %
Platelets: 261 10*3/uL (ref 150–379)
RBC: 4.92 x10E6/uL (ref 4.14–5.80)
RDW: 13.5 % (ref 12.3–15.4)
WBC: 4.9 10*3/uL (ref 3.4–10.8)

## 2017-06-03 LAB — COMPREHENSIVE METABOLIC PANEL
A/G RATIO: 1.6 (ref 1.2–2.2)
ALBUMIN: 4.6 g/dL (ref 3.5–5.5)
ALK PHOS: 65 IU/L (ref 39–117)
ALT: 43 IU/L (ref 0–44)
AST: 49 IU/L — AB (ref 0–40)
BILIRUBIN TOTAL: 0.3 mg/dL (ref 0.0–1.2)
BUN / CREAT RATIO: 6 — AB (ref 9–20)
BUN: 5 mg/dL — ABNORMAL LOW (ref 6–24)
CHLORIDE: 98 mmol/L (ref 96–106)
CO2: 23 mmol/L (ref 20–29)
CREATININE: 0.89 mg/dL (ref 0.76–1.27)
Calcium: 9.5 mg/dL (ref 8.7–10.2)
GFR calc Af Amer: 108 mL/min/{1.73_m2} (ref >59)
GFR calc non Af Amer: 94 mL/min/{1.73_m2} (ref >59)
GLOBULIN, TOTAL: 2.8 g/dL (ref 1.5–4.5)
Glucose: 82 mg/dL (ref 65–99)
POTASSIUM: 3.8 mmol/L (ref 3.5–5.2)
SODIUM: 136 mmol/L (ref 134–144)
Total Protein: 7.4 g/dL (ref 6.0–8.5)

## 2017-06-03 LAB — LIPID PANEL
CHOLESTEROL TOTAL: 166 mg/dL (ref 100–199)
Chol/HDL Ratio: 2.6 ratio (ref 0.0–5.0)
HDL: 65 mg/dL (ref >39)
LDL CALC: 56 mg/dL (ref 0–99)
Triglycerides: 224 mg/dL — ABNORMAL HIGH (ref 0–149)
VLDL Cholesterol Cal: 45 mg/dL — ABNORMAL HIGH (ref 5–40)

## 2017-07-11 ENCOUNTER — Other Ambulatory Visit: Payer: Self-pay | Admitting: Family Medicine

## 2017-07-11 DIAGNOSIS — IMO0001 Reserved for inherently not codable concepts without codable children: Secondary | ICD-10-CM

## 2017-07-11 DIAGNOSIS — E119 Type 2 diabetes mellitus without complications: Secondary | ICD-10-CM

## 2017-07-11 DIAGNOSIS — E1165 Type 2 diabetes mellitus with hyperglycemia: Principal | ICD-10-CM

## 2017-10-03 ENCOUNTER — Other Ambulatory Visit: Payer: Self-pay | Admitting: Family Medicine

## 2017-10-03 DIAGNOSIS — E1159 Type 2 diabetes mellitus with other circulatory complications: Secondary | ICD-10-CM

## 2017-10-03 DIAGNOSIS — E1165 Type 2 diabetes mellitus with hyperglycemia: Secondary | ICD-10-CM

## 2017-10-03 DIAGNOSIS — E119 Type 2 diabetes mellitus without complications: Secondary | ICD-10-CM

## 2017-10-03 DIAGNOSIS — I10 Essential (primary) hypertension: Principal | ICD-10-CM

## 2017-10-03 DIAGNOSIS — IMO0001 Reserved for inherently not codable concepts without codable children: Secondary | ICD-10-CM

## 2017-10-03 DIAGNOSIS — I152 Hypertension secondary to endocrine disorders: Secondary | ICD-10-CM

## 2018-04-23 ENCOUNTER — Telehealth: Payer: Self-pay | Admitting: Family Medicine

## 2018-04-23 NOTE — Telephone Encounter (Signed)
Dismissal letter in guarantor snapshot  °

## 2018-09-09 ENCOUNTER — Other Ambulatory Visit: Payer: Self-pay | Admitting: Family Medicine

## 2018-09-09 DIAGNOSIS — E1159 Type 2 diabetes mellitus with other circulatory complications: Secondary | ICD-10-CM

## 2018-09-09 DIAGNOSIS — E119 Type 2 diabetes mellitus without complications: Secondary | ICD-10-CM

## 2018-09-09 DIAGNOSIS — I152 Hypertension secondary to endocrine disorders: Secondary | ICD-10-CM

## 2018-09-09 DIAGNOSIS — IMO0001 Reserved for inherently not codable concepts without codable children: Secondary | ICD-10-CM

## 2018-12-16 ENCOUNTER — Other Ambulatory Visit: Payer: Self-pay

## 2018-12-17 ENCOUNTER — Encounter: Payer: Self-pay | Admitting: Medical

## 2018-12-17 ENCOUNTER — Other Ambulatory Visit: Payer: Self-pay

## 2018-12-17 ENCOUNTER — Ambulatory Visit (HOSPITAL_BASED_OUTPATIENT_CLINIC_OR_DEPARTMENT_OTHER)
Admission: RE | Admit: 2018-12-17 | Discharge: 2018-12-17 | Disposition: A | Discharge status: Home / Self Care | Payer: Commercial Managed Care - PPO | Source: Ambulatory Visit | Attending: Medical | Admitting: Medical

## 2018-12-17 ENCOUNTER — Ambulatory Visit (INDEPENDENT_AMBULATORY_CARE_PROVIDER_SITE_OTHER): Payer: Commercial Managed Care - PPO | Admitting: Medical

## 2018-12-17 VITALS — BP 157/75 | HR 75 | Temp 97.4°F | Wt 225.0 lb

## 2018-12-17 DIAGNOSIS — E119 Type 2 diabetes mellitus without complications: Secondary | ICD-10-CM | POA: Diagnosis not present

## 2018-12-17 DIAGNOSIS — E1159 Type 2 diabetes mellitus with other circulatory complications: Secondary | ICD-10-CM

## 2018-12-17 DIAGNOSIS — T148XXA Other injury of unspecified body region, initial encounter: Secondary | ICD-10-CM

## 2018-12-17 DIAGNOSIS — E785 Hyperlipidemia, unspecified: Secondary | ICD-10-CM | POA: Diagnosis not present

## 2018-12-17 DIAGNOSIS — M545 Low back pain, unspecified: Secondary | ICD-10-CM

## 2018-12-17 DIAGNOSIS — G8929 Other chronic pain: Secondary | ICD-10-CM | POA: Insufficient documentation

## 2018-12-17 DIAGNOSIS — S80812D Abrasion, left lower leg, subsequent encounter: Secondary | ICD-10-CM

## 2018-12-17 DIAGNOSIS — L089 Local infection of the skin and subcutaneous tissue, unspecified: Secondary | ICD-10-CM | POA: Diagnosis not present

## 2018-12-17 DIAGNOSIS — I10 Essential (primary) hypertension: Secondary | ICD-10-CM

## 2018-12-17 DIAGNOSIS — F17200 Nicotine dependence, unspecified, uncomplicated: Secondary | ICD-10-CM

## 2018-12-17 DIAGNOSIS — I152 Hypertension secondary to endocrine disorders: Secondary | ICD-10-CM

## 2018-12-17 LAB — CBC WITH DIFFERENTIAL/PLATELET
Basophils Absolute: 0 10*3/uL (ref 0.0–0.1)
Basophils Relative: 0.5 % (ref 0.0–3.0)
Eosinophils Absolute: 0.2 10*3/uL (ref 0.0–0.7)
Eosinophils Relative: 3.6 % (ref 0.0–5.0)
HCT: 41.9 % (ref 39.0–52.0)
Hemoglobin: 14.2 g/dL (ref 13.0–17.0)
Lymphocytes Relative: 47.9 % — ABNORMAL HIGH (ref 12.0–46.0)
Lymphs Abs: 2.8 10*3/uL (ref 0.7–4.0)
MCHC: 33.9 g/dL (ref 30.0–36.0)
MCV: 89.6 fl (ref 78.0–100.0)
Monocytes Absolute: 0.4 10*3/uL (ref 0.1–1.0)
Monocytes Relative: 6.1 % (ref 3.0–12.0)
Neutro Abs: 2.5 10*3/uL (ref 1.4–7.7)
Neutrophils Relative %: 41.9 % — ABNORMAL LOW (ref 43.0–77.0)
Platelets: 227 10*3/uL (ref 150.0–400.0)
RBC: 4.68 Mil/uL (ref 4.22–5.81)
RDW: 12.8 % (ref 11.5–15.5)
WBC: 5.8 10*3/uL (ref 4.0–10.5)

## 2018-12-17 LAB — COMPREHENSIVE METABOLIC PANEL WITH GFR
ALT: 32 U/L (ref 0–53)
AST: 26 U/L (ref 0–37)
Albumin: 4.3 g/dL (ref 3.5–5.2)
Alkaline Phosphatase: 69 U/L (ref 39–117)
BUN: 9 mg/dL (ref 6–23)
CO2: 30 meq/L (ref 19–32)
Calcium: 9.6 mg/dL (ref 8.4–10.5)
Chloride: 95 meq/L — ABNORMAL LOW (ref 96–112)
Creatinine, Ser: 0.9 mg/dL (ref 0.40–1.50)
GFR: 103.82 mL/min
Glucose, Bld: 329 mg/dL — ABNORMAL HIGH (ref 70–99)
Potassium: 4.2 meq/L (ref 3.5–5.1)
Sodium: 133 meq/L — ABNORMAL LOW (ref 135–145)
Total Bilirubin: 0.5 mg/dL (ref 0.2–1.2)
Total Protein: 6.9 g/dL (ref 6.0–8.3)

## 2018-12-17 LAB — LIPID PANEL
Cholesterol: 186 mg/dL (ref 0–200)
HDL: 55.3 mg/dL
NonHDL: 130.49
Total CHOL/HDL Ratio: 3
Triglycerides: 215 mg/dL — ABNORMAL HIGH (ref 0.0–149.0)
VLDL: 43 mg/dL — ABNORMAL HIGH (ref 0.0–40.0)

## 2018-12-17 LAB — HEMOGLOBIN A1C: Hgb A1c MFr Bld: 11.7 % — ABNORMAL HIGH (ref 4.6–6.5)

## 2018-12-17 LAB — LDL CHOLESTEROL, DIRECT: Direct LDL: 111 mg/dL

## 2018-12-17 MED ORDER — METFORMIN HCL 1000 MG PO TABS: 1000.0000 mg | ORAL_TABLET | Freq: Every day | ORAL | 0 refills | Status: DC

## 2018-12-17 MED ORDER — LISINOPRIL-HYDROCHLOROTHIAZIDE 10-12.5 MG PO TABS: 1.0000 | ORAL_TABLET | Freq: Every day | ORAL | 0 refills | Status: DC

## 2018-12-17 MED ORDER — MUPIROCIN 2 % EX OINT: TOPICAL_OINTMENT | CUTANEOUS | 0 refills | Status: DC

## 2018-12-17 MED ORDER — DOXYCYCLINE HYCLATE 100 MG PO TABS: 100.0000 mg | ORAL_TABLET | Freq: Two times a day (BID) | ORAL | 0 refills | Status: DC

## 2018-12-17 NOTE — Patient Instructions (Addendum)
Nice to meet you.  For htn, I am refilling your lisinopril hctz today.  For diabetes refilling metformin. But holding off on other medication that can drop sugar level. Will need to assess a1c before deciding if will use.  For skin infection rx doxycycline. For left lower ext abrasion rx mupirocin.  For chronic back pain get lspine xray.  After lab review will decide what med might be be able to give for your back pain.  For smoking will plan to offer wellbutrin in future.  Follow up in 2 weeks or as needed

## 2018-12-17 NOTE — Progress Notes (Signed)
Subjective:    Patient ID: [name], male    DOB: Aug 22, 1955, 63 y.o.   MRN: [id number]  HPI  Pt Engineer, building services. Pt does not exercise. Pt states tries to eat healthy. But admits not always successful. Pt smokes less than a pack a day. 30+ years. Beer every day. 66 ounzes. Weekends more. Process of getting divorced. 3 children.   Patient here to establish PCP. He has not seen a provider in over 2 years. He has been diagnosed with hypertension and diabetes. He's been trying to "stretch my medicines out." Not taking them regularly until he decided to find a provider. He needs refills on everything. Pt not on bp med today. He borrowed bp med from friend yesterday but not sure what it was.  He does not regularly check his blood sugar or blood pressure.  Chronic low back pain. Patient states he has been having trouble with his lower back for the past 5-6 weeks. States he works with heavy equipment, but does not recall specific injury. Pain is midline of lower back. Denies radiation to legs, no numbness or tingling. At rest low back pain is 2/10 soreness. Some mornings when he gets out of bed pain is 8/10. Pain is worse with activity. Some days are worse than others. The pain often makes it difficult for him to sleep. He has not been taking anything for it. States Advil has not helped in the past. No gait changes.   Also reports a blackhead on his right upper back he wants to be checked. It has been there for about 2 months. It has gotten larger over the past few days. Not painful. States friend tried to pop it a few weeks ago but wasn't able to get much out.  States he tries to eat healthy and watch his carb intake. He does not get exercise outside of work. 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  Declines flu shot. Declines pneumonia vaccine.  Reports he had normal colonoscopy about 2 years ago.  Hx of type II diabetes in past.    Review of Systems    Constitutional: Negative for chills and fever.  HENT: Negative for congestion and sore throat.   Eyes: Negative for visual disturbance.  Respiratory: Negative for chest tightness, shortness of breath and wheezing.   Cardiovascular: Negative for chest pain and palpitations.  Gastrointestinal: Negative for abdominal pain, blood in stool, constipation, diarrhea and nausea.  Endocrine: Negative for polydipsia, polyphagia and polyuria.  Genitourinary: Negative for dysuria, frequency, hematuria and urgency.  Musculoskeletal: Positive for back pain.  Skin: Positive for wound.       Scattered abrasions to bilateral lower legs related to fall over a shopping cart a month ago. Left leg abrasion has not healed.   Blackhead/abcess on upper right back, itchy  Neurological: Negative for dizziness, light-headedness and headaches.  Psychiatric/Behavioral: Negative for behavioral problems and confusion. The patient is not nervous/anxious.     Past Medical History:  Diagnosis Date   Arthritis    Blood transfusion without reported diagnosis    30 yrs ago   Chronic low back pain    Diabetes ([place])    Hypertension    Insomnia    Muscle cramps    Skin fissure    Uncontrolled type 2 diabetes mellitus without complication, without long-term current use of insulin ([place]) 01/24/2015     Social History   Socioeconomic History   Marital status: Married  Spouse name: Not on file   Number of children: 3   Years of education: Not on file   Highest education level: Not on file  Occupational History   Occupation: truck Music therapist: [place] resource strain: Not on file   Food insecurity    Worry: Not on file    Inability: Not on file   Transportation needs    Medical: Not on file    Non-medical: Not on file  Tobacco Use   Smoking status: Current Every Day Smoker    Packs/day: 1.00    Years: 30.00    Pack years: 30.00    Types:  Cigarettes   Smokeless tobacco: Never Used  Substance and Sexual Activity   Alcohol use: Yes    Comment: 2 24oz cans of beer twice a day   Drug use: No   Sexual activity: Not on file  Lifestyle   Physical activity    Days per week: Not on file    Minutes per session: Not on file   Stress: Not on file  Relationships   Social connections    Talks on phone: Not on file    Gets together: Not on file    Attends religious service: Not on file    Active member of club or organization: Not on file    Attends meetings of clubs or organizations: Not on file    Relationship status: Not on file   Intimate partner violence    Fear of current or ex partner: Not on file    Emotionally abused: Not on file    Physically abused: Not on file    Forced sexual activity: Not on file  Other Topics Concern   Not on file  Social History Narrative   Not on file    Past Surgical History:  Procedure Laterality Date   CHOLECYSTECTOMY     EYE MUSCLE SURGERY     bilateral   FINGER SURGERY     right hand   TOE AMPUTATION     left    Family History  Problem Relation Age of Onset   Seizures Father 61       broken hip   Diabetes Sister    Hypertension Sister    Diabetes Sister    Diabetes Sister    Colon cancer Neg Hx     No Known Allergies  Current Outpatient Medications on File Prior to Visit  Medication Sig Dispense Refill   glyBURIDE (DIABETA) 1.25 MG tablet TAKE 1 TABLET BY MOUTH ONCE DAILY WITH BREAKFAST 90 tablet 0   lisinopril-hydrochlorothiazide (PRINZIDE,ZESTORETIC) 10-12.5 MG tablet TAKE 1 TABLET BY MOUTH ONCE DAILY 90 tablet 0   metFORMIN (GLUCOPHAGE) 1000 MG tablet Take 1 tablet (1,000 mg total) by mouth daily. 90 tablet 1   aspirin 81 MG tablet Take 81 mg by mouth daily.     CINNAMON PO Take 2 capsules by mouth daily.     GARLIC PO Take 2 capsules by mouth daily.     glyBURIDE (DIABETA) 1.25 MG tablet TAKE 1 TABLET BY MOUTH ONCE DAILY (Patient not  taking: Reported on 12/17/2018) 60 tablet 0   MICROLET LANCETS MISC Pt uses microlet lancets for his microlet next lancet device. (Patient not taking: Reported on 12/17/2018) 50 each 0   Current Facility-Administered Medications on File Prior to Visit  Medication Dose Route Frequency Provider Last Rate Last Dose   0.9 %  sodium chloride infusion  500 mL Intravenous Continuous Milus Banister, MD        There were no vitals taken for this visit.      Objective:   Physical Exam  General Mental Status- Alert. General Appearance- Not in acute distress.   Skin General: Color- Normal Color. Moisture- Normal Moisture.  Neck Carotid Arteries- Normal color. Moisture- Normal Moisture. No carotid bruits. No JVD.  Chest and Lung Exam Auscultation: Breath Sounds:-Normal.  Cardiovascular Auscultation:Rythm- Regular. Murmurs & Other Heart Sounds:Auscultation of the heart reveals- No Murmurs.  Abdomen Inspection:-Inspeection Normal. Palpation/Percussion:Note:No mass. Palpation and Percussion of the abdomen reveal- Non Tender, Non Distended + BS, no rebound or guarding.    Neurologic Cranial Nerve exam:- CN III-XII intact(No nystagmus), symmetric smile. Strength:- 5/5 equal and symmetric strength both upper and lower extremities.  Left lower ext- mid tibia has 10 mm approximate wide abrasion. Not deep. But in process healing.      Assessment & Plan:  Nice to meet you.  For htn, I am refilling your lisinopril hctz today.  For diabetes refilling metformin. But holding off on other medication that can drop sugar level. Will need to assess a1c before deciding if will use.  For skin infection rx doxycycline. For left lower ext abrasion rx mupirocin.  For chronic back pain get lspine xray.  After lab review will decide what med might be be able to give for your back pain.  Follow up in 2 weeks or as needed  [employer], PA-C

## 2018-12-18 ENCOUNTER — Telehealth: Payer: Self-pay | Admitting: Medical

## 2018-12-18 MED ORDER — ATORVASTATIN CALCIUM 10 MG PO TABS: 10.0000 mg | ORAL_TABLET | Freq: Every day | ORAL | 3 refills | Status: AC

## 2018-12-18 MED ORDER — SITAGLIPTIN PHOSPHATE 50 MG PO TABS: 50.0000 mg | ORAL_TABLET | Freq: Every day | ORAL | 3 refills | Status: DC

## 2018-12-18 NOTE — Telephone Encounter (Signed)
Open to rx onglyza and atorvastatin.

## 2018-12-20 ENCOUNTER — Telehealth: Payer: Self-pay | Admitting: Medical

## 2018-12-20 DIAGNOSIS — M5410 Radiculopathy, site unspecified: Secondary | ICD-10-CM

## 2018-12-20 DIAGNOSIS — G8929 Other chronic pain: Secondary | ICD-10-CM

## 2018-12-20 DIAGNOSIS — M545 Low back pain, unspecified: Secondary | ICD-10-CM

## 2018-12-20 NOTE — Telephone Encounter (Signed)
Mri lumbar spine ordered.

## 2018-12-25 ENCOUNTER — Ambulatory Visit (HOSPITAL_BASED_OUTPATIENT_CLINIC_OR_DEPARTMENT_OTHER)
Admission: RE | Admit: 2018-12-25 | Discharge: 2018-12-25 | Disposition: A | Discharge status: Home / Self Care | Payer: Commercial Managed Care - PPO | Source: Ambulatory Visit | Attending: Medical | Admitting: Medical

## 2018-12-25 ENCOUNTER — Other Ambulatory Visit: Payer: Self-pay

## 2018-12-25 DIAGNOSIS — M5410 Radiculopathy, site unspecified: Secondary | ICD-10-CM | POA: Diagnosis present

## 2018-12-25 DIAGNOSIS — M545 Low back pain, unspecified: Secondary | ICD-10-CM

## 2018-12-25 DIAGNOSIS — G8929 Other chronic pain: Secondary | ICD-10-CM | POA: Insufficient documentation

## 2018-12-26 ENCOUNTER — Telehealth: Payer: Self-pay | Admitting: Medical

## 2018-12-26 DIAGNOSIS — M545 Low back pain, unspecified: Secondary | ICD-10-CM

## 2018-12-26 DIAGNOSIS — G8929 Other chronic pain: Secondary | ICD-10-CM

## 2018-12-26 DIAGNOSIS — M5410 Radiculopathy, site unspecified: Secondary | ICD-10-CM

## 2018-12-26 NOTE — Telephone Encounter (Signed)
Referral to neurosurgeon placed. 

## 2018-12-31 ENCOUNTER — Encounter: Payer: Self-pay | Admitting: Medical

## 2018-12-31 ENCOUNTER — Telehealth: Payer: Self-pay | Admitting: Medical

## 2018-12-31 ENCOUNTER — Other Ambulatory Visit: Payer: Self-pay

## 2018-12-31 ENCOUNTER — Ambulatory Visit (INDEPENDENT_AMBULATORY_CARE_PROVIDER_SITE_OTHER): Payer: Commercial Managed Care - PPO | Admitting: Medical

## 2018-12-31 VITALS — BP 128/71 | HR 73 | Temp 97.0°F | Resp 16 | Ht 74.0 in | Wt 221.0 lb

## 2018-12-31 DIAGNOSIS — G8929 Other chronic pain: Secondary | ICD-10-CM

## 2018-12-31 DIAGNOSIS — I10 Essential (primary) hypertension: Secondary | ICD-10-CM | POA: Diagnosis not present

## 2018-12-31 DIAGNOSIS — M545 Low back pain, unspecified: Secondary | ICD-10-CM

## 2018-12-31 DIAGNOSIS — M5410 Radiculopathy, site unspecified: Secondary | ICD-10-CM | POA: Diagnosis not present

## 2018-12-31 DIAGNOSIS — B079 Viral wart, unspecified: Secondary | ICD-10-CM

## 2018-12-31 DIAGNOSIS — L989 Disorder of the skin and subcutaneous tissue, unspecified: Secondary | ICD-10-CM

## 2018-12-31 DIAGNOSIS — E119 Type 2 diabetes mellitus without complications: Secondary | ICD-10-CM

## 2018-12-31 MED ORDER — GLYBURIDE 5 MG PO TABS: 5.0000 mg | ORAL_TABLET | Freq: Every day | ORAL | 3 refills | Status: DC

## 2018-12-31 MED ORDER — TRAMADOL HCL 50 MG PO TABS: 50.0000 mg | ORAL_TABLET | Freq: Four times a day (QID) | ORAL | 0 refills | Status: AC | PRN

## 2018-12-31 MED ORDER — METFORMIN HCL 1000 MG PO TABS: 1000.0000 mg | ORAL_TABLET | Freq: Two times a day (BID) | ORAL | 3 refills | Status: AC

## 2018-12-31 MED ORDER — MELOXICAM 7.5 MG PO TABS: 7.5000 mg | ORAL_TABLET | Freq: Every day | ORAL | 0 refills | Status: DC

## 2018-12-31 NOTE — Patient Instructions (Signed)
Your blood sugar average was very high on recent A1c check.  I want you to start the metformin 1000 mg twice daily and I prescribed glyburide 5 mg daily.  Discontinue Januvia since it was too expensive for you to even fill.  Please try to get free glucometer from your insurance company and check your blood pressure 1-2 times daily.  Want some fasting readings fasting and some after you eat.  Signs need update in couple weeks as I want to know if sugars are controlled and if you are having any low sugar events.  For back pain with significant findings on MRI, I sent message to referral staff to investigate on your appointment with neurosurgeon.  Sent in prescription of low-dose meloxicam and tramadol.  Rx advisement given on both medications.  Particularly advised not to operate heavy machinery or drive with tramadol.  Blood pressure well controlled.  Continue current BP medication.  Chronic left foot callus with history of amputation.  Irregular cracked epidermis as well.  Decided to go ahead and refer to podiatrist for evaluation.  If any changes to area prior to referral please notify us.  For right index finger wart, I went ahead and place referral to dermatology since you failed cryotherapy treatment.  Follow-up in 3 months or as needed.

## 2018-12-31 NOTE — Progress Notes (Signed)
Subjective:    Patient ID: [name], male    DOB: Sep 26, 1955, 63 y.o.   MRN: [id number]  HPI  Patient is here for follow-up.  He was seen 2 weeks ago to establish care with PCP with diagnoses of chronic low back pain, HTN, DM, and skin infection to right upper back.  Reports the abscess on his back cleared up after taking the antibiotc he was given last time.  Labs at that visit showed blood glucose 329, A1c 11.7. He had been off of his regular medications for the past 2 years. What meds he did have left he was stretching out to last longer. Pt does not know where is glucometer is. Pt is on metformin. I tried to to rx [place] and he was told cost $1300.    He has not been checking his blood sugar or blood pressure at home. Denies headaches, dizziness, lightheadedness. No increase in thirst, hunger, or urination.   Reports his back pain is about the same. Pain today is 5/10 with occasional pain down right leg. He had an xray and MRI since last visit. Referral was placed to [facility] last week, but states he has not heard anything yet. No controlled medication on controlled med review site.   MRI results 12/25/18: IMPRESSION: Lumbar spine spondylosis most notable at L5-S1 with a posterior central annular fissure tiny focal central disc protrusion contacting the bilateral descending S1 nerve roots. There is also severe right and moderate to severe left neural foraminal narrowing with mild-to-moderate central canal stenosis.  Patient complaining of irritated lesion to right index finger. Calloused, wart-like lesion. 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 Reports it has been there for the past 3 years. No acute changes, but he is ready for it to be gone.  Also reports he has had a calloused area on his left foot since he was 63 years old after having middle toes amputated related to fork lift accident. States the callous gets rough,  and he occasionally has to shave it down. He had seen a podiatrist occasionally, but states he prefers to do it himself because they always cut it too close and made it bleed.  Pt states chronic crack in his skin for 20 yrs. He states prior skin graft failed. No dc from area.  Pt declines flu vaccine.  Review of Systems  Constitutional: Negative for chills, fatigue and fever.  HENT: Negative for congestion and sore throat.   Respiratory: Negative for cough, chest tightness and shortness of breath.   Cardiovascular: Negative for chest pain and palpitations.  Gastrointestinal: Negative for abdominal pain, constipation, diarrhea and nausea.  Genitourinary: Negative for dysuria, flank pain, hematuria and urgency.  Musculoskeletal: Positive for back pain. Negative for arthralgias and myalgias.  Skin: Negative for rash and wound.       Right index finger with calloused, wart-like lesion that is tender and irritating; chronic calloused lesion to left foot following fork-lift accident with toe amputations at age 8  Neurological: Negative for dizziness, light-headedness and headaches.  Psychiatric/Behavioral: Negative for confusion, self-injury and suicidal ideas.       Objective:   Physical Exam  General Mental Status- Alert. General Appearance- Not in acute distress.   Skin General: Color- Normal Color. Moisture- Normal Moisture.  Neck Carotid Arteries- Normal color. Moisture- Normal Moisture. No carotid bruits. No JVD.  Chest and Lung Exam Auscultation: Breath Sounds:-Normal.  Cardiovascular Auscultation:Rythm- Regular. Murmurs & Other Heart  Sounds:Auscultation of the heart reveals- No Murmurs.  Abdomen Inspection:-Inspeection Normal. Palpation/Percussion:Note:No mass. Palpation and Percussion of the abdomen reveal- Non Tender, Non Distended + BS, no rebound or guarding.    Neurologic Cranial Nerve exam:- CN III-XII intact(No nystagmus), symmetric smile. Strength:- 5/5  equal and symmetric strength both upper and lower extremities.  Left foot- absent 2nd and 3rd toe. Crack in epidermis. With thick dark callous(chronic appearance. No dc. No smell.     Rt hand- index finger below dip joint. Small probable warty type growth. No redness or discharge. No skin breakdown. Pt has tried cryofreeze   40 minutes spent with pt. Interviewed by NP student and myself. I modified note accordingly and made tx decisions. 50% of time spent counseling pt on plan going forward. Assessment & Plan:  Your blood sugar average was very high on recent A1c check.  I want you to start the metformin 1000 mg twice daily and I prescribed glyburide 5 mg daily.  Discontinue Januvia since it was too expensive for you to even fill.  Please try to get free glucometer from your insurance company and check your blood pressure 1-2 times daily.  Want some fasting readings fasting and some after you eat.  Signs need update in couple weeks as I want to know if sugars are controlled and if you are having any low sugar events.  For back pain with significant findings on MRI, I sent message to referral staff to investigate on your appointment with neurosurgeon.  Sent in prescription of low-dose meloxicam and tramadol.  Rx advisement given on both medications.  Particularly advised not to operate heavy machinery or drive with tramadol.  Blood pressure well controlled.  Continue current BP medication.  Chronic left foot callus with history of amputation.  Irregular cracked epidermis as well.  Decided to go ahead and refer to podiatrist for evaluation.  If any changes to area prior to referral please notify us.  For right index finger wart, I went ahead and place referral to dermatology since you failed cryotherapy treatment.  Follow-up in 3 months or as needed.  Mackie Pai, PA-C

## 2018-12-31 NOTE — Telephone Encounter (Signed)
Referral to neurosurgeon placed. Pt has not been contacted. Will you follow up on referral. Then update pt.

## 2018-12-31 NOTE — Telephone Encounter (Signed)
called and left msg for Lea - new pt coord at [facility]

## 2018-12-31 NOTE — Telephone Encounter (Signed)
opened to review. 

## 2019-01-10 ENCOUNTER — Other Ambulatory Visit: Payer: Self-pay | Admitting: Medical

## 2019-01-10 DIAGNOSIS — I10 Essential (primary) hypertension: Secondary | ICD-10-CM

## 2019-01-10 DIAGNOSIS — I152 Hypertension secondary to endocrine disorders: Secondary | ICD-10-CM

## 2019-01-10 DIAGNOSIS — E1159 Type 2 diabetes mellitus with other circulatory complications: Secondary | ICD-10-CM

## 2019-01-10 NOTE — Telephone Encounter (Signed)
Copied from [place] [phone]. Topic: Quick Communication - Rx Refill/Question >> Jan 10, 2019  3:33 PM Leward Quan A wrote: Medication: lisinopril-hydrochlorothiazide (ZESTORETIC) 10-12.5 MG tablet   Has the patient contacted their pharmacy? Yes.   (Agent: If no, request that the patient contact the pharmacy for the refill.) (Agent: If yes, when and what did the pharmacy advise?)  Preferred Pharmacy (with phone number or street name): [facility] ([place]), [place] - [phone] (Phone) [phone] (Fax)    Agent: Please be advised that RX refills may take up to 3 business days. We ask that you follow-up with your pharmacy.

## 2019-01-10 NOTE — Telephone Encounter (Signed)
Requested medication (s) are due for refill today:yes  Requested medication (s) are on the active medication list:yes  Last refill:  12/17/2018  Future visit scheduled: yes  Notes to clinic: Na low    Requested Prescriptions  Pending Prescriptions Disp Refills   lisinopril-hydrochlorothiazide (ZESTORETIC) 10-12.5 MG tablet 14 tablet 0    Sig: Take 1 tablet by mouth daily.     Cardiovascular:  ACEI + Diuretic Combos Failed - 01/10/2019  3:41 PM      Failed - Na in normal range and within 180 days    Sodium  Date Value Ref Range Status  12/17/2018 133 (L) 135 - 145 mEq/L Final  06/02/2017 136 134 - 144 mmol/L Final         Passed - K in normal range and within 180 days    Potassium  Date Value Ref Range Status  12/17/2018 4.2 3.5 - 5.1 mEq/L Final         Passed - Cr in normal range and within 180 days    Creat  Date Value Ref Range Status  02/04/2016 0.92 0.70 - 1.33 mg/dL Final    Comment:      For patients > or = 61 years of age: The upper reference limit for Creatinine is approximately 13% higher for people identified as African-American.      Creatinine, Ser  Date Value Ref Range Status  12/17/2018 0.90 0.40 - 1.50 mg/dL Final         Passed - Ca in normal range and within 180 days    Calcium  Date Value Ref Range Status  12/17/2018 9.6 8.4 - 10.5 mg/dL Final         Passed - Patient is not pregnant      Passed - Last BP in normal range    BP Readings from Last 1 Encounters:  12/31/18 128/71         Passed - Valid encounter within last 6 months    Recent Outpatient Visits          1 week ago Chronic midline low back pain, unspecified whether sciatica present   [facility] [place], PA-C   3 weeks ago Chronic midline low back pain, unspecified whether sciatica present   [facility] at [place], [employer] [facility]            In 2 months  [place], Percell Miller, PA-C [employer] at [employer], [facility]

## 2019-01-11 MED ORDER — LISINOPRIL-HYDROCHLOROTHIAZIDE 10-12.5 MG PO TABS: 1.0000 | ORAL_TABLET | Freq: Every day | ORAL | 1 refills | Status: DC

## 2019-04-01 ENCOUNTER — Ambulatory Visit: Payer: Commercial Managed Care - PPO | Admitting: Medical

## 2019-04-08 ENCOUNTER — Encounter: Payer: Self-pay | Admitting: Medical

## 2019-04-08 ENCOUNTER — Other Ambulatory Visit: Payer: Self-pay

## 2019-04-08 ENCOUNTER — Ambulatory Visit (INDEPENDENT_AMBULATORY_CARE_PROVIDER_SITE_OTHER): Payer: Commercial Managed Care - PPO | Admitting: Medical

## 2019-04-08 VITALS — Ht 74.0 in | Wt 217.0 lb

## 2019-04-08 DIAGNOSIS — I10 Essential (primary) hypertension: Secondary | ICD-10-CM | POA: Diagnosis not present

## 2019-04-08 DIAGNOSIS — E785 Hyperlipidemia, unspecified: Secondary | ICD-10-CM | POA: Diagnosis not present

## 2019-04-08 DIAGNOSIS — E119 Type 2 diabetes mellitus without complications: Secondary | ICD-10-CM

## 2019-04-08 NOTE — Progress Notes (Signed)
   Subjective:    Patient ID: [name], male    DOB: 05-18-1955, 64 y.o.   MRN: [id number]  HPI Virtual Visit via Telephone Note  I connected with Costella Hatcher on 04/08/19 at 11:00 AM EST by telephone and verified that I am speaking with the correct person using two identifiers.  Location: Patient: home Provider: home  Pt did not check vitals.   I discussed the limitations, risks, security and privacy concerns of performing an evaluation and management service by telephone and the availability of in person appointments. I also discussed with the patient that there may be a patient responsible charge related to this service. The patient expressed understanding and agreed to proceed.   History of Present Illness: Pt does not have bp cuff at home. No cardiac or neurologic signs or symptoms. Still on bp meds. Asked to get bp otc to check 3-4 times a week.  Pt has diabetes. Last a1-c was 11.7 2 weeks ago when checking 98-110. Pt is on metformin and  Diabeta. He states cut out a lot of sugar in diet. He states he walks a lot at work.  Pt does have high cholesterol and is on atorvastatin.     Observations/Objective: General- no acute distress, pleasant, alert and oriented. Normal speech.  Assessment and Plan: For htn continue current bp meds. But please get otc bp cuff and check bp 3-4 times weekly. Want to confirm bp approx 130/80.    For diabetes and high cholesterol, continue current meds and your scheduled next week on Friday for fasting. Labs.  Follow up date to be determined after lab review.  Mackie Pai, PA-C  Follow Up Instructions:    I discussed the assessment and treatment plan with the patient. The patient was provided an opportunity to ask questions and all were answered. The patient agreed with the plan and demonstrated an understanding of the instructions.   The patient was advised to call back or seek an in-person evaluation if the symptoms worsen  or if the condition fails to improve as anticipated.  I provided 20 minutes of non-face-to-face time during this encounter.   Mackie Pai, PA-C    Review of Systems  Constitutional: Negative for chills, fatigue and fever.  Respiratory: Negative for cough, chest tightness, shortness of breath and wheezing.   Cardiovascular: Negative for chest pain and palpitations.  Gastrointestinal: Negative for abdominal distention, abdominal pain, constipation and diarrhea.  Musculoskeletal: Negative for back pain.  Neurological: Negative for dizziness, syncope, weakness, light-headedness and numbness.  Hematological: Negative for adenopathy. Does not bruise/bleed easily.  Psychiatric/Behavioral: Negative for behavioral problems.       Objective:   Physical Exam        Assessment & Plan:

## 2019-04-08 NOTE — Patient Instructions (Addendum)
For htn continue current bp meds. But please get otc bp cuff and check bp 3-4 times weekly. Want to confirm bp approx 130/80.    For diabetes and high cholesterol, continue current meds and your scheduled next week on Friday for fasting. Labs.  Follow up date to be determined after lab review.

## 2019-04-15 ENCOUNTER — Other Ambulatory Visit: Payer: Self-pay

## 2019-04-15 ENCOUNTER — Other Ambulatory Visit (INDEPENDENT_AMBULATORY_CARE_PROVIDER_SITE_OTHER): Payer: Commercial Managed Care - PPO

## 2019-04-15 DIAGNOSIS — E785 Hyperlipidemia, unspecified: Secondary | ICD-10-CM | POA: Diagnosis not present

## 2019-04-15 DIAGNOSIS — I10 Essential (primary) hypertension: Secondary | ICD-10-CM

## 2019-04-15 DIAGNOSIS — E119 Type 2 diabetes mellitus without complications: Secondary | ICD-10-CM | POA: Diagnosis not present

## 2019-04-15 LAB — COMPREHENSIVE METABOLIC PANEL
ALT: 34 U/L (ref 0–53)
AST: 25 U/L (ref 0–37)
Albumin: 4.4 g/dL (ref 3.5–5.2)
Alkaline Phosphatase: 57 U/L (ref 39–117)
BUN: 11 mg/dL (ref 6–23)
CO2: 29 mEq/L (ref 19–32)
Calcium: 9.6 mg/dL (ref 8.4–10.5)
Chloride: 98 mEq/L (ref 96–112)
Creatinine, Ser: 0.9 mg/dL (ref 0.40–1.50)
GFR: 103.7 mL/min (ref >60.00)
Glucose, Bld: 227 mg/dL — ABNORMAL HIGH (ref 70–99)
Potassium: 4 mEq/L (ref 3.5–5.1)
Sodium: 135 mEq/L (ref 135–145)
Total Bilirubin: 0.4 mg/dL (ref 0.2–1.2)
Total Protein: 7 g/dL (ref 6.0–8.3)

## 2019-04-15 LAB — LIPID PANEL
Cholesterol: 116 mg/dL (ref 0–200)
HDL: 57.4 mg/dL (ref >39.00)
LDL Cholesterol: 38 mg/dL (ref 0–99)
NonHDL: 58.71
Total CHOL/HDL Ratio: 2
Triglycerides: 104 mg/dL (ref 0.0–149.0)
VLDL: 20.8 mg/dL (ref 0.0–40.0)

## 2019-04-15 LAB — HEMOGLOBIN A1C: Hgb A1c MFr Bld: 9.5 % — ABNORMAL HIGH (ref 4.6–6.5)

## 2019-04-16 ENCOUNTER — Telehealth: Payer: Self-pay | Admitting: Medical

## 2019-04-16 MED ORDER — SITAGLIPTIN PHOSPHATE 50 MG PO TABS: 50.0000 mg | ORAL_TABLET | Freq: Every day | ORAL | 3 refills | Status: DC

## 2019-04-16 NOTE — Telephone Encounter (Signed)
Rx [place] sent to pt pharmacy.

## 2019-06-15 ENCOUNTER — Other Ambulatory Visit: Payer: Self-pay

## 2019-06-15 ENCOUNTER — Telehealth: Payer: Self-pay | Admitting: Medical

## 2019-06-15 ENCOUNTER — Ambulatory Visit (INDEPENDENT_AMBULATORY_CARE_PROVIDER_SITE_OTHER): Payer: Commercial Managed Care - PPO | Admitting: Medical

## 2019-06-15 VITALS — BP 130/75 | HR 101 | Temp 97.5°F | Resp 18 | Ht 74.0 in | Wt 219.6 lb

## 2019-06-15 DIAGNOSIS — M545 Low back pain, unspecified: Secondary | ICD-10-CM

## 2019-06-15 DIAGNOSIS — G8929 Other chronic pain: Secondary | ICD-10-CM | POA: Diagnosis not present

## 2019-06-15 DIAGNOSIS — M25532 Pain in left wrist: Secondary | ICD-10-CM

## 2019-06-15 DIAGNOSIS — E119 Type 2 diabetes mellitus without complications: Secondary | ICD-10-CM | POA: Diagnosis not present

## 2019-06-15 DIAGNOSIS — G5602 Carpal tunnel syndrome, left upper limb: Secondary | ICD-10-CM | POA: Diagnosis not present

## 2019-06-15 MED ORDER — CYCLOBENZAPRINE HCL 10 MG PO TABS: ORAL_TABLET | ORAL | 0 refills | Status: DC

## 2019-06-15 MED ORDER — KETOROLAC TROMETHAMINE 60 MG/2ML IM SOLN
60.0000 mg | Freq: Once | INTRAMUSCULAR | Status: AC
  Administered 2019-06-15: 60 mg via INTRAMUSCULAR

## 2019-06-15 MED ORDER — HYDROCODONE-ACETAMINOPHEN 5-325 MG PO TABS: 1.0000 | ORAL_TABLET | Freq: Four times a day (QID) | ORAL | 0 refills | Status: DC | PRN

## 2019-06-15 MED ORDER — HYDROCODONE-ACETAMINOPHEN 5-325 MG PO TABS: 1.0000 | ORAL_TABLET | Freq: Four times a day (QID) | ORAL | 0 refills | Status: AC | PRN

## 2019-06-15 MED ORDER — MELOXICAM 7.5 MG PO TABS: 7.5000 mg | ORAL_TABLET | Freq: Every day | ORAL | 0 refills | Status: DC

## 2019-06-15 MED ORDER — SITAGLIPTIN PHOSPHATE 50 MG PO TABS: 50.0000 mg | ORAL_TABLET | Freq: Every day | ORAL | 3 refills | Status: DC

## 2019-06-15 MED FILL — JANUVIA 50 MG TABLET: 50 | 30 days supply | Qty: 30 | Fill #0

## 2019-06-15 NOTE — Patient Instructions (Signed)
For your chronic low back pain with significant findings on prior MRI, I did send message to one of our referral coordinators and asked her to call back the previous clinic that we try to refer you to in October.  We will asked them to expedite your referral.  Not sure what happened but we do have on record that referral was attempted.  Will prescribe meloxicam to take daily starting tomorrow.  Also Flexeril muscle relaxant to use prior to sleep.  In addition making Norco available for pain but make sure that you do not drive or operate heavy machinery when using Norco.  For left wrist pain with likely carpal tunnel syndrome, I did place left wrist x-ray order.  We will see if your pain and symptoms improved with meloxicam.  Continue to use brace.  Work note/excuse written today.  For diabetes I tried to resend your Januvia downstairs and hopefully you get a better price.  Follow-up in 7 to 10 days or as needed.

## 2019-06-15 NOTE — Progress Notes (Signed)
Subjective:    Patient ID: [name], male    DOB: 1955-06-13, 64 y.o.   MRN: [id number]  HPI  Pt in with some recent moderate to severe back pain. Last seen in October. Back pain for at least 6 years. He states pain is getting severe now. Pain radiates to thigh occasionally.  Last visit HPI  "Reports his back pain is about the same. Pain today is 5/10 with occasional pain down right leg. He had an xray and MRI since last visit. Referral was placed to [facility] last week, but states he has not heard anything yet. No controlled medication on controlled med review site.   MRI results 12/25/18: IMPRESSION: Lumbar spine spondylosis most notable at L5-S1 with a posterior central annular fissure tiny focal central disc protrusion contacting the bilateral descending S1 nerve roots. There is also severe right and moderate to severe left neural foraminal narrowing with mild-to-moderate central canal stenosis."   I put in referral and our staff followed up later.  Note   Recd msg from [facility] requesting to f/u on referral - pt having severe back pain - called and left msg for Lea - new pt coord at [facility] had recent work up for left hand tingling and numbness for 9-8 months. He is Dealer. The other day had achiness in left hand. Negative work up for stroke in the ED. 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 Pt has brace on.  No neck pain and no radiating type pain.  Diabetes with a1c 9.5 04-15-2019. Januvia was not well covered.    Review of Systems  Constitutional: Negative for chills, fatigue and fever.  Respiratory: Negative for cough, chest tightness, shortness of breath and stridor.   Cardiovascular: Negative for chest pain and palpitations.  Gastrointestinal: Negative for abdominal pain.  Genitourinary: Negative for flank pain and frequency.  Musculoskeletal: Positive for back pain. Negative for joint swelling.      Left wrist pain.  Skin: Negative for rash.  Neurological: Negative for dizziness and headaches.  Hematological: Negative for adenopathy. Does not bruise/bleed easily.  Psychiatric/Behavioral: Negative for behavioral problems and confusion.    Past Medical History:  Diagnosis Date  . Arthritis   . Blood transfusion without reported diagnosis    30 yrs ago  . Chronic low back pain   . Diabetes ([place])   . Hypertension   . Insomnia   . Muscle cramps   . Skin fissure   . Uncontrolled type 2 diabetes mellitus without complication, without long-term current use of insulin 01/24/2015     Social History   Socioeconomic History  . Marital status: Married    Spouse name: Not on file  . Number of children: 3  . Years of education: Not on file  . Highest education level: Not on file  Occupational History  . Occupation: truck Music therapist: [employer]  Tobacco Use  . Smoking status: Current Every Day Smoker    Packs/day: 1.00    Years: 30.00    Pack years: 30.00    Types: Cigarettes  . Smokeless tobacco: Never Used  Substance and Sexual Activity  . Alcohol use: Yes    Comment: 2 24oz cans of beer twice a day  . Drug use: No  . Sexual activity: Not on file  Other Topics Concern  . Not on file  Social History Narrative  . Not on file  Social Determinants of Health   Financial Resource Strain:   . Difficulty of Paying Living Expenses:   Food Insecurity:   . Worried About Charity fundraiser in the Last Year:   . Arboriculturist in the Last Year:   Transportation Needs:   . Film/video editor (Medical):   Marland Kitchen Lack of Transportation (Non-Medical):   Physical Activity:   . Days of Exercise per Week:   . Minutes of Exercise per Session:   Stress:   . Feeling of Stress :   Social Connections:   . Frequency of Communication with Friends and Family:   . Frequency of Social Gatherings with Friends and Family:   . Attends Religious Services:   . Active Member  of Clubs or Organizations:   . Attends Archivist Meetings:   Marland Kitchen Marital Status:   Intimate Partner Violence:   . Fear of Current or Ex-Partner:   . Emotionally Abused:   Marland Kitchen Physically Abused:   . Sexually Abused:     Past Surgical History:  Procedure Laterality Date  . CHOLECYSTECTOMY    . EYE MUSCLE SURGERY     bilateral  . FINGER SURGERY     right hand  . TOE AMPUTATION     left    Family History  Problem Relation Age of Onset  . Seizures Father 28       broken hip  . Diabetes Sister   . Hypertension Sister   . Diabetes Sister   . Diabetes Sister   . Colon cancer Neg Hx     No Known Allergies  Current Outpatient Medications on File Prior to Visit  Medication Sig Dispense Refill  . atorvastatin (LIPITOR) 10 MG tablet Take 1 tablet (10 mg total) by mouth daily. 90 tablet 3  . CINNAMON PO Take 2 capsules by mouth daily.    Marland Kitchen GARLIC PO Take 2 capsules by mouth daily.    Marland Kitchen glyBURIDE (DIABETA) 5 MG tablet Take 1 tablet (5 mg total) by mouth daily with breakfast. 90 tablet 3  . lisinopril-hydrochlorothiazide (ZESTORETIC) 10-12.5 MG tablet Take 1 tablet by mouth daily. 90 tablet 1  . meloxicam (MOBIC) 7.5 MG tablet Take 1 tablet (7.5 mg total) by mouth daily. 30 tablet 0  . metFORMIN (GLUCOPHAGE) 1000 MG tablet Take 1 tablet (1,000 mg total) by mouth 2 (two) times daily with a meal. 180 tablet 3  . MICROLET LANCETS MISC Pt uses microlet lancets for his microlet next lancet device. 50 each 0   Current Facility-Administered Medications on File Prior to Visit  Medication Dose Route Frequency Provider Last Rate Last Admin  . 0.9 %  sodium chloride infusion  500 mL Intravenous Continuous Milus Banister, MD        BP 130/75 (BP Location: Left Arm, Patient Position: Sitting, Cuff Size: Large)   Pulse (!) 101   Temp (!) 97.5 F (36.4 C) (Temporal)   Resp 18   Ht 6\' 2"  (1.88 m)   Wt 219 lb 9.6 oz (99.6 kg)   SpO2 100%   BMI 28.19 kg/m       Objective:    Physical Exam  General Appearance- Not in acute distress.    Chest and Lung Exam Auscultation: Breath sounds:-Normal. Clear even and unlabored. Adventitious sounds:- No Adventitious sounds.  Cardiovascular Auscultation:Rythm - Regular, rate and rythm. Heart Sounds -Normal heart sounds.  Abdomen Inspection:-Inspection Normal.  Palpation/Perucssion: Palpation and Percussion of the abdomen reveal- Non Tender, No Rebound  tenderness, No rigidity(Guarding) and No Palpable abdominal masses.  Liver:-Normal.  Spleen:- Normal.   Back Mid lumbar spine and rt si tenderness to palpation. .  Lower ext neurologic  L5-S1 sensation intact bilaterally. Normal patellar reflexes bilaterally. No foot drop bilaterally.  Left wrist - intact range of motion with mild pain. No swelling. Pt states better than was in ED. Wearing wrist cock up spliint.      Assessment & Plan:  For your chronic low back pain with significant findings on prior MRI, I did send message to one of our referral coordinators and asked her to call back the previous clinic that we try to refer you to in October.  We will asked them to expedite your referral.  Not sure what happened but we do have on record that referral was attempted.  Will prescribe meloxicam to take daily starting tomorrow.  Also Flexeril muscle relaxant to use prior to sleep.  In addition making Norco available for pain but make sure that you do not drive or operate heavy machinery when using Norco.  For left wrist pain with likely carpal tunnel syndrome, I did place left wrist x-ray order.  We will see if your pain and symptoms improved with meloxicam.  Continue to use brace.  Work note/excuse written today.  For diabetes I tried to resend your Januvia downstairs and hopefully you get a better price.  Follow-up in 7 to 10 days or as needed.  Time spent with patient today was 40  minutes which consisted of chart review, discussing diagnosis, work up,  referrals, treatment and documentation.  Mackie Pai, PA-C

## 2019-06-15 NOTE — Telephone Encounter (Signed)
In October I referred pt to [facility] we followed up and you left message with them. They never called pt?? Will you call them and notify them this is 5 months later. See if they can get him in asap.

## 2019-06-17 NOTE — Telephone Encounter (Signed)
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

## 2019-06-17 NOTE — Telephone Encounter (Signed)
I spoke with [name] at [facility] - they have a chart for the pt but no documentation of contact. They do not believe they have spoken to him and cannot locate original referral. Info has been faxed and [name] will reach out to the patient to schedule.

## 2020-07-07 IMAGING — MR MR LUMBAR SPINE W/O CM
4 of 5 series · 24 of 48 positions shown · non-contrast
Comparison: None.

CLINICAL DATA: Back pain

EXAM:
MRI LUMBAR SPINE WITHOUT CONTRAST
TECHNIQUE: Multiplanar, multisequence MR imaging of the lumbar spine was
performed. No intravenous contrast was administered.

[Series 3: T1 · sagittal · 4.0mm · 0.88mm/px · 5 of 16 slices shown (1 of 2)]
[im 1/16]
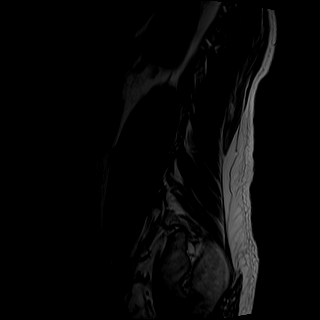
[im 4/16]
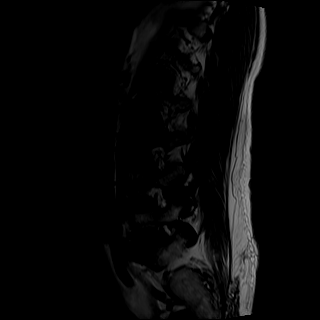
[im 8/16]
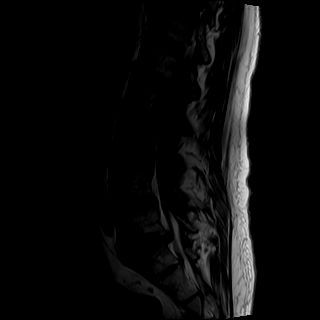
[im 12/16]
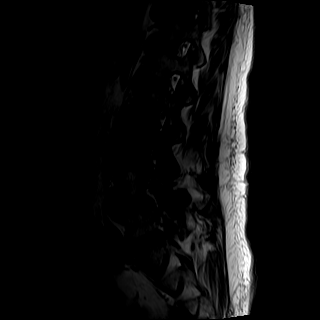
[im 16/16]
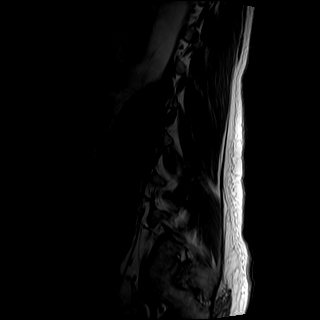

[Series 4: T2 · sagittal · 4.0mm · 0.88mm/px · 5 of 16 slices shown (1 of 2)]
[im 1/16]
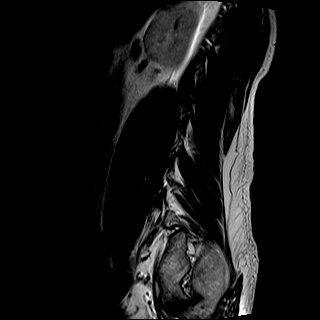
[im 4/16]
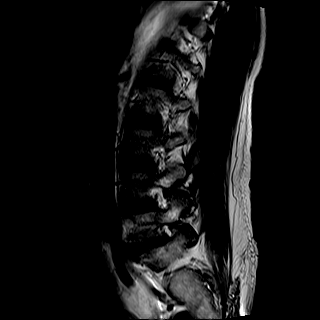
[im 8/16]
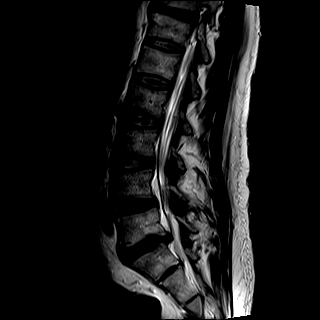
[im 12/16]
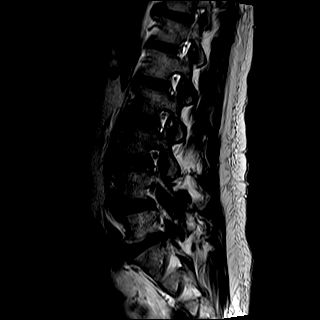
[im 16/16]
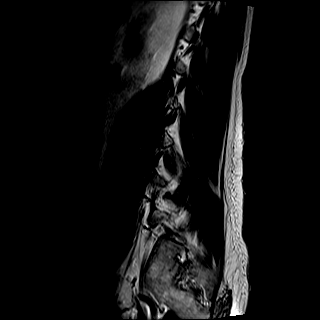

[Series 6: T2 · axial · 4.0mm · 0.39mm/px · z∈[-44,+161]mm · 10 of 44 slices shown (2 of 2)]
[im 3/44]
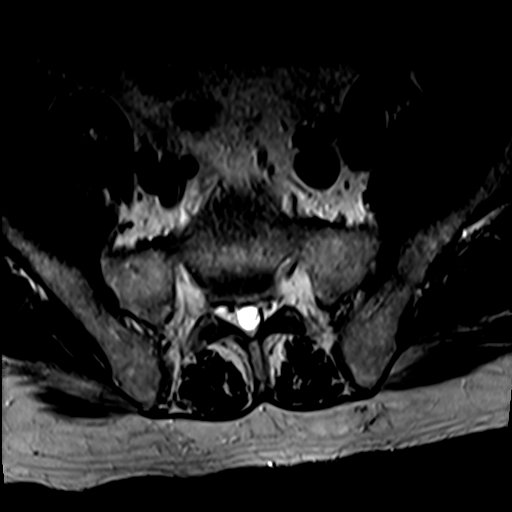
[im 6/44]
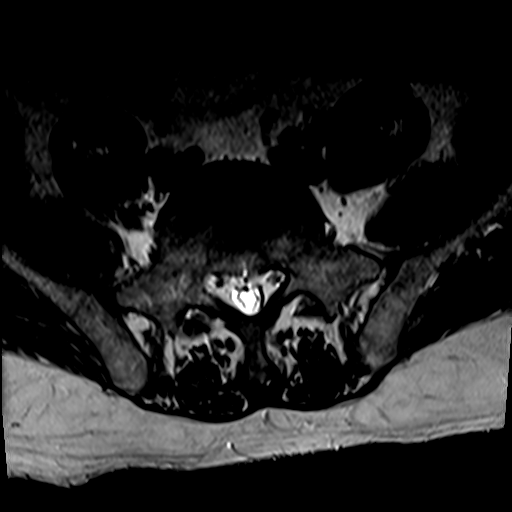
[im 9/44]
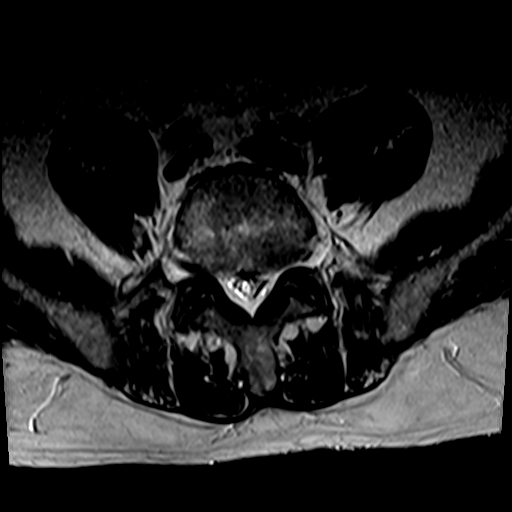
[im 15/44]
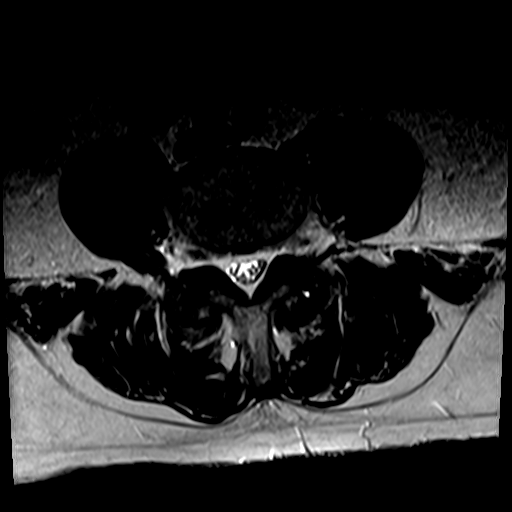
[im 21/44]
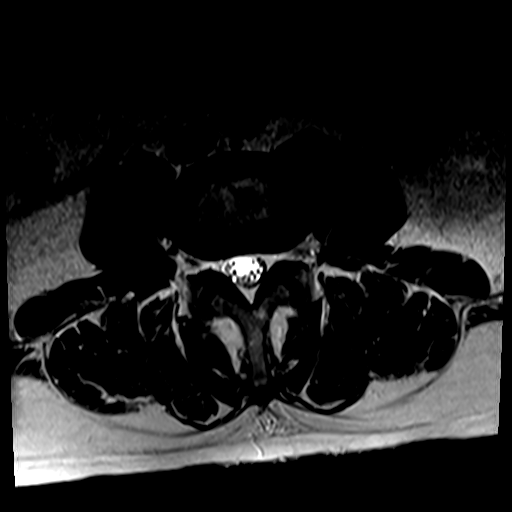
[im 23/44]
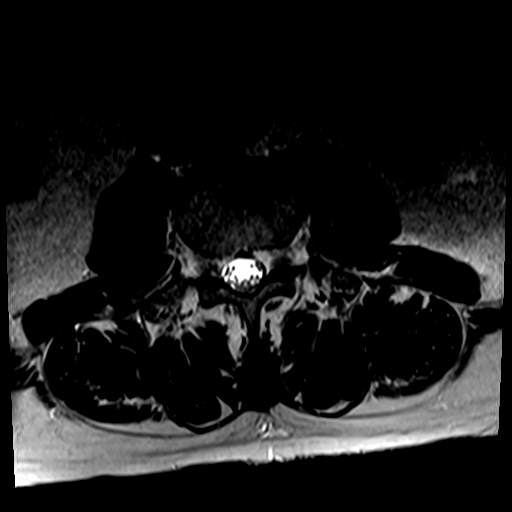
[im 26/44]
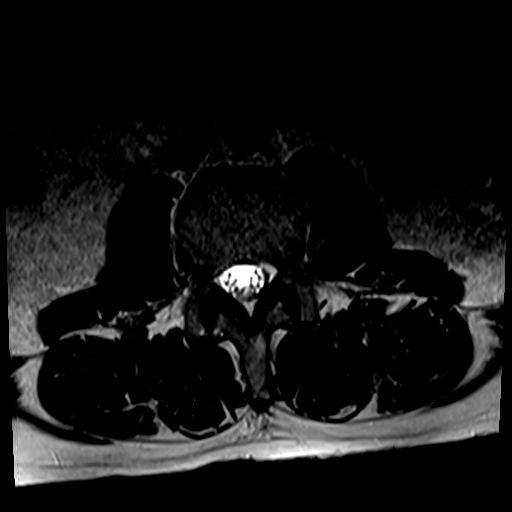
[im 32/44]
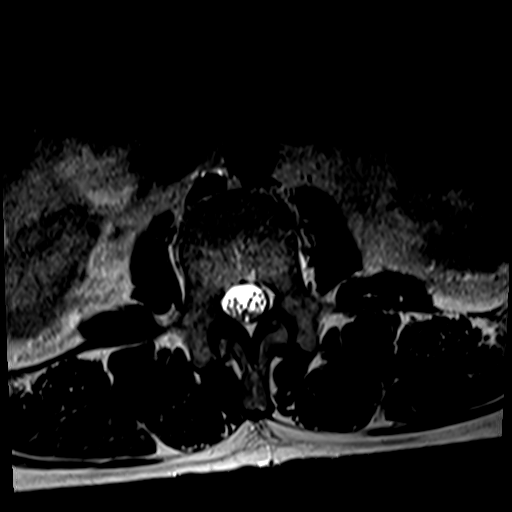
[im 38/44]
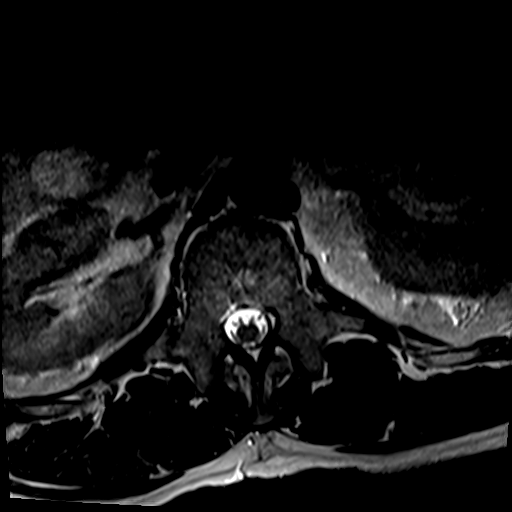
[im 44/44]
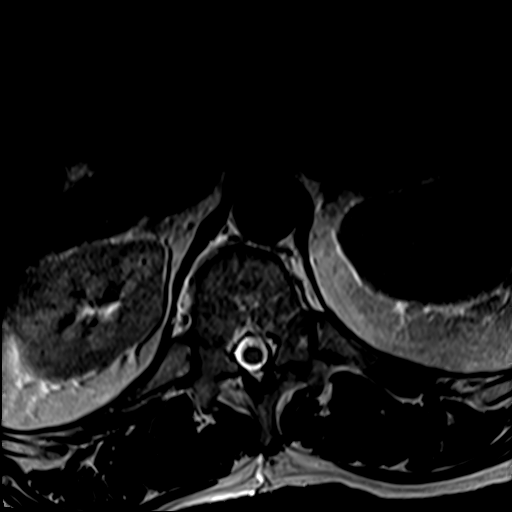

[Series 7: T1 · axial · 4.0mm · 0.39mm/px · z∈[-44,+131]mm · 4 of 44 slices shown (2 of 2)]
[im 3/44]
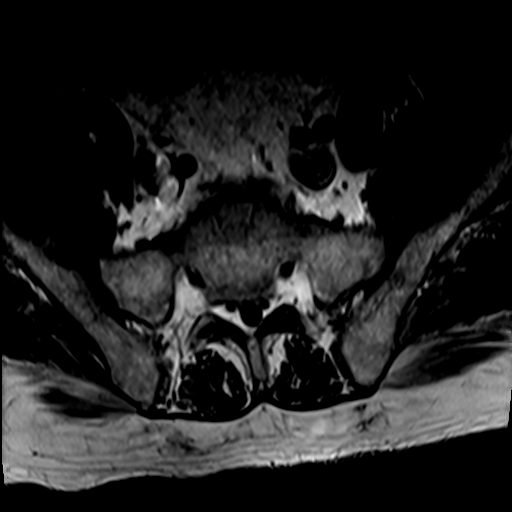
[im 6/44]
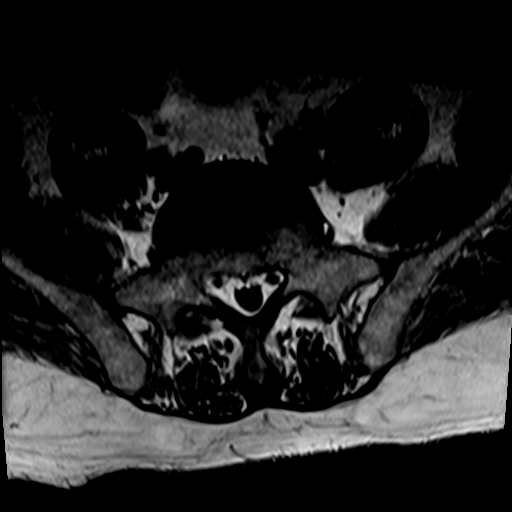
[im 23/44]
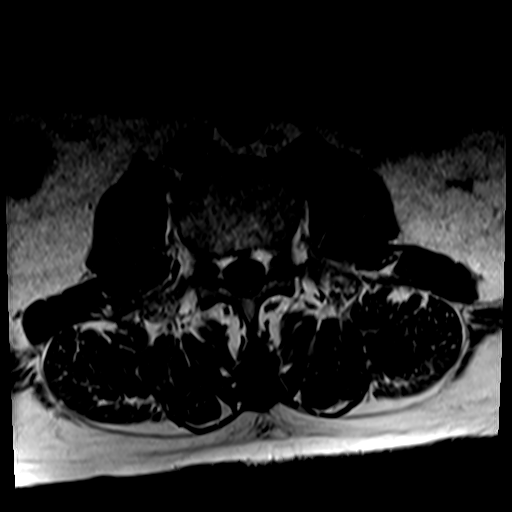
[im 38/44]
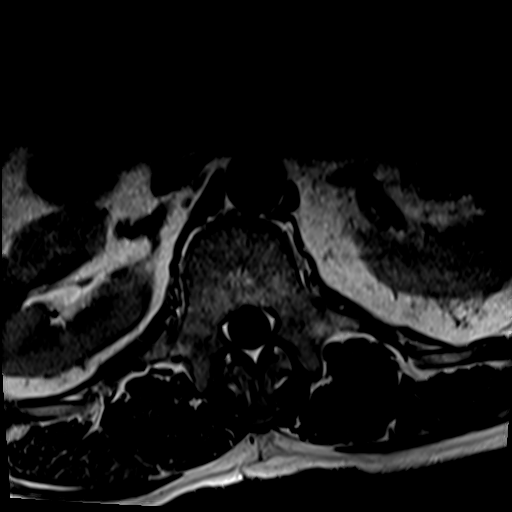

[24 of 48 positions shown; findings below may reference images not displayed]

FINDINGS: Segmentation: There are 5 non-rib bearing lumbar type vertebral
bodies with the last intervertebral disc space labeled as L5-S1.

Alignment: There is a minimal anterolisthesis L4 on L5 and minimal
retrolisthesis of L5 on S1.

Vertebrae: The vertebral body heights are well maintained. No
fracture, marrow edema,or pathologic marrow infiltration. There is a
T1/T2 bright osseous lesion seen in the anterior T12 vertebral body,
likely intraosseous hemangioma.

Conus medullaris and cauda equina: Conus extends to the L1 level.
Conus and cauda equina appear normal.

Paraspinal and other soft tissues: The paraspinal soft tissues and
visualized retroperitoneal structures are unremarkable. The
sacroiliac joints are intact.

Disc levels:

T12-L1:  No significant canal or neural foraminal narrowing.

L1-L2:   No significant canal or neural foraminal narrowing.

L2-L3: There is a minimal broad-based disc bulge with ligamentum
flavum hypertrophy, however no significant canal or neural foraminal
narrowing.

L3-L4: There is a broad-based disc bulge with ligamentum flavum
hypertrophy and facet arthrosis which causes mild bilateral neural
foraminal narrowing. There is mild effacement anterior thecal sac.

L4-L5: There is a broad-based disc bulge with minimal disc
uncovering, ligamentum flavum hypertrophy and facet arthrosis. There
is moderate bilateral neural foraminal narrowing. Epidural
lipomatosis is also noted. The central thecal sac measures 7 mm in
AP diameter.

L5-S1: There is a broad-based disc bulge with facet arthrosis and
ligamentum flavum hypertrophy. There is a posterior central annular
fissure. There is also a focal central disc protrusion which
contacts the bilateral descending S1 nerve roots. There is severe
right and moderate to severe left neural foraminal narrowing.
Epidural lipomatosis is noted. The central thecal sac measures 7 mm
in AP diameter.
IMPRESSION: Lumbar spine spondylosis most notable at L5-S1 with a posterior
central annular fissure tiny focal central disc protrusion
contacting the bilateral descending S1 nerve roots. There is also
severe right and moderate to severe left neural foraminal narrowing
with mild-to-moderate central canal stenosis.

## 2021-02-18 ENCOUNTER — Emergency Department (HOSPITAL_COMMUNITY)
Admission: EM | Admit: 2021-02-18 | Discharge: 2021-02-18 | Disposition: A | Discharge status: Home / Self Care | Payer: Self-pay | Attending: Emergency Medicine | Admitting: Emergency Medicine

## 2021-02-18 ENCOUNTER — Emergency Department (HOSPITAL_COMMUNITY): Payer: Self-pay

## 2021-02-18 ENCOUNTER — Other Ambulatory Visit: Payer: Self-pay

## 2021-02-18 ENCOUNTER — Encounter (HOSPITAL_COMMUNITY): Payer: Self-pay | Admitting: *Deleted

## 2021-02-18 DIAGNOSIS — R079 Chest pain, unspecified: Secondary | ICD-10-CM | POA: Insufficient documentation

## 2021-02-18 DIAGNOSIS — Z5321 Procedure and treatment not carried out due to patient leaving prior to being seen by health care provider: Secondary | ICD-10-CM | POA: Insufficient documentation

## 2021-02-18 DIAGNOSIS — J00 Acute nasopharyngitis [common cold]: Secondary | ICD-10-CM | POA: Insufficient documentation

## 2021-02-18 DIAGNOSIS — Z20822 Contact with and (suspected) exposure to covid-19: Secondary | ICD-10-CM | POA: Insufficient documentation

## 2021-02-18 LAB — RESP PANEL BY RT-PCR (FLU A&B, COVID) ARPGX2
Influenza A by PCR: NEGATIVE
Influenza B by PCR: NEGATIVE
SARS Coronavirus 2 by RT PCR: NEGATIVE

## 2021-02-18 NOTE — ED Notes (Signed)
Patient left on own accord °

## 2021-02-18 NOTE — ED Provider Notes (Signed)
MSE was initiated and I personally evaluated the patient and placed orders (if any) at  12:59 AM on February 18, 2021.  Here with cough, right chest pain with cough, chills. Symptoms started 2 days ago.   Today's Vitals   02/18/21 0040  BP: (!) 155/87  Pulse: 79  Resp: 16  Temp: 98.1 F (36.7 C)  TempSrc: Oral  SpO2: 94%   There is no height or weight on file to calculate BMI.  Lungs clear RRR No chest wall tenderness.  The patient appears stable so that the remainder of the MSE may be completed by another provider.   Charlann Lange, PA-C 02/18/21 Rod Mae, MD 02/18/21 [phone]

## 2021-02-18 NOTE — ED Triage Notes (Signed)
The pt is c/o chest pain tonight  he has had a cold for 3-4 days.  Pt falls asleep between statements

## 2021-04-30 ENCOUNTER — Emergency Department (HOSPITAL_COMMUNITY): Payer: Self-pay

## 2021-04-30 ENCOUNTER — Encounter (HOSPITAL_COMMUNITY): Payer: Self-pay | Admitting: Emergency Medicine

## 2021-04-30 ENCOUNTER — Emergency Department (HOSPITAL_COMMUNITY)
Admission: EM | Admit: 2021-04-30 | Discharge: 2021-05-01 | Disposition: A | Discharge status: Home / Self Care | Payer: Self-pay | Attending: Emergency Medicine | Admitting: Emergency Medicine

## 2021-04-30 DIAGNOSIS — M79645 Pain in left finger(s): Secondary | ICD-10-CM | POA: Insufficient documentation

## 2021-04-30 DIAGNOSIS — S61211A Laceration without foreign body of left index finger without damage to nail, initial encounter: Secondary | ICD-10-CM | POA: Insufficient documentation

## 2021-04-30 DIAGNOSIS — W268XXA Contact with other sharp object(s), not elsewhere classified, initial encounter: Secondary | ICD-10-CM | POA: Insufficient documentation

## 2021-04-30 DIAGNOSIS — S62631A Displaced fracture of distal phalanx of left index finger, initial encounter for closed fracture: Secondary | ICD-10-CM | POA: Insufficient documentation

## 2021-04-30 DIAGNOSIS — Y990 Civilian activity done for income or pay: Secondary | ICD-10-CM | POA: Insufficient documentation

## 2021-04-30 DIAGNOSIS — Z79899 Other long term (current) drug therapy: Secondary | ICD-10-CM | POA: Insufficient documentation

## 2021-04-30 DIAGNOSIS — Z23 Encounter for immunization: Secondary | ICD-10-CM | POA: Insufficient documentation

## 2021-04-30 MED ORDER — BUPIVACAINE HCL (PF) 0.5 % IJ SOLN
10.0000 mL | Freq: Once | INTRAMUSCULAR | Status: AC
  Administered 2021-05-01: 10 mL
  Filled 2021-04-30: qty 10

## 2021-04-30 MED ORDER — ACETAMINOPHEN 500 MG PO TABS
1000.0000 mg | ORAL_TABLET | Freq: Once | ORAL | Status: AC
  Administered 2021-04-30: 1000 mg via ORAL
  Filled 2021-04-30: qty 2

## 2021-04-30 MED ORDER — TETANUS-DIPHTH-ACELL PERTUSSIS 5-2.5-18.5 LF-MCG/0.5 IM SUSY
0.5000 mL | PREFILLED_SYRINGE | Freq: Once | INTRAMUSCULAR | Status: AC
  Administered 2021-05-01: 0.5 mL via INTRAMUSCULAR
  Filled 2021-04-30: qty 0.5

## 2021-04-30 NOTE — ED Triage Notes (Signed)
Patient here with complaint of injury to left thumb and index finger that occurred at approximately 1130. 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 Hemorrhage controlled, unknown last tetanus vaccine. Patient alert, oriented, and in no apparent distress at this time.

## 2021-04-30 NOTE — ED Provider Triage Note (Signed)
Emergency Medicine Provider Triage Evaluation Note  [name] , a 67 y.o. male  was evaluated in triage.  Pt complains of injury to left thumb and left index finger.  Patient reports that approximately 1130 while at work he cut his fingers on a piece of metal.  Unknown last tetanus shot.  Patient is right-hand dominant.  Review of Systems  Positive: Wound Negative: Numbness, weakness  Physical Exam  BP (!) 161/127 (BP Location: Right Arm)    Pulse 84    Temp 98.6 F (37 C) (Oral)    Resp 16    SpO2 98%  Gen:   Awake, no distress   Resp:  Normal effort  MSK:   Moves extremities without difficulty.  Skin tear to radial aspect of left thumb.  Approximately 2 cm laceration to dorsum of left index finger above DIP.  Wounds are jagged.  Sensation intact to all aspects of all digits of left hand.  Patient has full range of motion to all digits of left hand. Other:    Medical Decision Making  Medically screening exam initiated at 12:11 PM.  Appropriate orders placed.  Caleel Kiner was informed that the remainder of the evaluation will be completed by another provider, this initial triage assessment does not replace that evaluation, and the importance of remaining in the ED until their evaluation is complete.     Loni Beckwith, [place] 04/30/21 1212

## 2021-05-01 MED ORDER — CEPHALEXIN 500 MG PO CAPS: 500.0000 mg | ORAL_CAPSULE | Freq: Four times a day (QID) | ORAL | 0 refills | Status: AC

## 2021-05-01 NOTE — Discharge Instructions (Addendum)
I am prescribing you an antibiotic to prevent infection in your finger.  This antibiotic is called Keflex.  Please take this 4 times a day for the next 7 days.  Do not stop taking this early.  Below is the contact information for [facility].  Please give them a call soon as possible to schedule an appointment for reevaluation of your finger.  You will need to have your stitches removed in 7 to 10 days.  I have attached information on how to properly maintain the laceration on your finger.  I would recommend applying the antibiotic ointment 1-2 times per day.  Please keep your finger clean and covered while at work.  Attached is a work note for tomorrow.  If you develop any new or worsening symptoms please come back to the emergency department.

## 2021-05-01 NOTE — ED Provider Notes (Signed)
[facility] EMERGENCY DEPARTMENT Provider Note   CSN: [id number] Arrival date & time: 04/30/21  1152     History  Chief Complaint  Patient presents with   Finger Injury    [name] is a 66 y.o. male.  HPI Patient is a 66 year old right-hand-dominant male who presents to the emergency department due to a laceration to the left index finger.  Patient states that he was working on a large vehicle and he cut his left index finger on a piece of metal.  This occurred around 11:30 AM.  Unsure of the timing of his last Tdap.  No numbness or weakness.    Home Medications Prior to Admission medications   Medication Sig Start Date End Date Taking? Authorizing Provider  cephALEXin (KEFLEX) 500 MG capsule Take 1 capsule (500 mg total) by mouth 4 (four) times daily for 7 days. 05/01/21 05/08/21 Yes Rayna Sexton, PA-C  atorvastatin (LIPITOR) 10 MG tablet Take 1 tablet (10 mg total) by mouth daily. 12/18/18   Saguier, Percell Miller, PA-C  CINNAMON PO Take 2 capsules by mouth daily.    [provider]  cyclobenzaprine (FLEXERIL) 10 MG tablet 1 tab po prior to sleep 06/15/19   Saguier, Percell Miller, PA-C  GARLIC PO Take 2 capsules by mouth daily.    [provider]  glyBURIDE (DIABETA) 5 MG tablet Take 1 tablet (5 mg total) by mouth daily with breakfast. 12/31/18   Saguier, Percell Miller, PA-C  HYDROcodone-acetaminophen (NORCO) 5-325 MG tablet Take 1 tablet by mouth every 6 (six) hours as needed for moderate pain. 06/15/19   Saguier, Percell Miller, PA-C  lisinopril-hydrochlorothiazide (ZESTORETIC) 10-12.5 MG tablet Take 1 tablet by mouth daily. 01/11/19   Saguier, Percell Miller, PA-C  meloxicam (MOBIC) 7.5 MG tablet Take 1 tablet (7.5 mg total) by mouth daily. 12/31/18   Saguier, Percell Miller, PA-C  meloxicam (MOBIC) 7.5 MG tablet Take 1 tablet (7.5 mg total) by mouth daily. 06/15/19   Saguier, Percell Miller, PA-C  metFORMIN (GLUCOPHAGE) 1000 MG tablet Take 1 tablet (1,000 mg total) by mouth 2 (two) times  daily with a meal. 12/31/18   Saguier, Percell Miller, PA-C  MICROLET LANCETS MISC Pt uses microlet lancets for his microlet next lancet device. 06/18/15   Henson, Vickie L, PA-C  sitaGLIPtin (JANUVIA) 50 MG tablet Take 1 tablet (50 mg total) by mouth daily. 06/15/19   Saguier, Percell Miller, PA-C      Allergies    Patient has no known allergies.    Review of Systems   Review of Systems  Skin:  Positive for wound. Negative for color change.  Neurological:  Negative for weakness and numbness.   Physical Exam Updated Vital Signs BP (!) 185/87 (BP Location: Right Arm)    Pulse 62    Temp 98.2 F (36.8 C) (Oral)    Resp 15    SpO2 99%  Physical Exam Vitals and nursing note reviewed.  Constitutional:      General: He is not in acute distress.    Appearance: He is well-developed.  HENT:     Head: Normocephalic and atraumatic.     Right Ear: External ear normal.     Left Ear: External ear normal.  Eyes:     General: No scleral icterus.       Right eye: No discharge.        Left eye: No discharge.     Conjunctiva/sclera: Conjunctivae normal.  Neck:     Trachea: No tracheal deviation.  Cardiovascular:     Rate and  Rhythm: Normal rate.  Pulmonary:     Effort: Pulmonary effort is normal. No respiratory distress.     Breath sounds: No stridor.  Abdominal:     General: There is no distension.  Musculoskeletal:        General: Tenderness present. No swelling or deformity.     Cervical back: Neck supple.     Comments: V-shaped 2 cm avulsion laceration along the palmar aspect of the distal phalanx of the left index finger.  Mild bleeding that improved with direct pressure.  Additional abrasion noted to the lateral aspect of the left thumb abutting the fingernail.  No involvement of either fingernail.  Distal sensation intact.  Good cap refill.  2+ radial pulses.  Skin:    General: Skin is warm and dry.     Findings: No rash.  Neurological:     General: No focal deficit present.     Mental Status: He  is alert and oriented to person, place, and time.     Cranial Nerves: Cranial nerve deficit: no gross deficits.    ED Results / Procedures / Treatments   Labs (all labs ordered are listed, but only abnormal results are displayed) Labs Reviewed - No data to display  EKG None  Radiology DG Hand Complete Left  Result Date: 04/30/2021 CLINICAL DATA:  Left index finger laceration. EXAM: LEFT HAND - COMPLETE 3+ VIEW COMPARISON:  Left fifth digit radiographs 12/18/2010 FINDINGS: Exostosis in the ring finger stable. Soft tissue swelling is present in the distal aspect of the index finger. A minimally displaced transverse fracture present in the distal phalanx. Slight angulation towards the ulna noted. No foreign body is present. Joints are located. IMPRESSION: 1. Minimally displaced transverse fracture of the distal phalanx with associated soft tissue swelling. 2. No foreign body. Electronically Signed   By: San Morelle M.D.   On: 04/30/2021 12:54    Procedures .Marland KitchenLaceration Repair  Date/Time: 05/01/2021 1:00 AM Performed by: Rayna Sexton, PA-C Authorized by: Rayna Sexton, PA-C   Consent:    Consent obtained:  Verbal   Consent given by:  Patient   Risks discussed:  Infection, need for additional repair, pain, poor cosmetic result and poor wound healing   Alternatives discussed:  No treatment and delayed treatment Universal protocol:    Procedure explained and questions answered to patient or proxy's satisfaction: yes     Relevant documents present and verified: yes     Test results available: yes     Imaging studies available: yes     Required blood products, implants, devices, and special equipment available: yes     Site/side marked: yes     Immediately prior to procedure, a time out was called: yes     Patient identity confirmed:  Verbally with patient Anesthesia:    Anesthesia method:  Local infiltration   Local anesthetic:  Bupivacaine 0.5% w/o epi Laceration  details:    Location: Left index finger.   Length (cm):  2 Pre-procedure details:    Preparation:  Patient was prepped and draped in usual sterile fashion Exploration:    Limited defect created (wound extended): no     Imaging obtained: x-ray     Imaging outcome: foreign body not noted     Wound exploration: wound explored through full range of motion     Wound extent: no foreign bodies/material noted     Contaminated: no   Treatment:    Area cleansed with:  Soap and water   Amount of  cleaning:  Extensive   Irrigation solution:  Tap water   Irrigation method:  Pressure wash Skin repair:    Repair method:  Sutures   Suture size:  5-0   Suture material:  Prolene   Suture technique:  Simple interrupted   Number of sutures:  6 Approximation:    Approximation:  Close Repair type:    Repair type:  Intermediate Post-procedure details:    Dressing:  Antibiotic ointment, non-adherent dressing and splint for protection   Procedure completion:  Tolerated well, no immediate complications    Medications Ordered in ED Medications  acetaminophen (TYLENOL) tablet 1,000 mg (1,000 mg Oral Given 04/30/21 1226)  bupivacaine(PF) (MARCAINE) 0.5 % injection 10 mL (10 mLs Infiltration Given by Other 05/01/21 0037)  Tdap (BOOSTRIX) injection 0.5 mL (0.5 mLs Intramuscular Given 05/01/21 0102)   ED Course/ Medical Decision Making/ A&P                           Medical Decision Making Risk Prescription drug management.  Pt is a 66 y.o. male who presents to the emergency department due to a laceration to the left index finger as well as an additional abrasion to the left thumb.  This occurred around 11:30 AM while working on a large vehicle.  Unsure of the timing of his last Tdap so this was updated in the emergency department.  Imaging: X-ray was obtained of the left hand in triage showing a minimally displaced transverse fracture of the distal phalanx with associated soft tissue swelling.  No foreign  body noted.  I, Rayna Sexton, PA-C, personally reviewed and evaluated these images and lab results as part of my medical decision-making.  Patient with a 2 cm V-shaped avulsion laceration to the palmar aspect of the left index finger overlying the distal phalanx.  Neurovascularly intact distal to the site.  Bleeding controlled with direct pressure.  Wound was cleaned extensively and closed with Prolene sutures.  Please see the procedure note above.  Given patient also had an underlying minimally displaced transverse fracture of the distal phalanx I placed the patient in a finger splint.  We will place patient on a course of Keflex as well to help prevent infection.  Patient given a referral to hand surgery for follow-up.  Feel that the patient is stable for discharge at this time and he is agreeable.  Discussed wound care at length and patient given wound care supplies.  Suture removal in 7 to 10 days.  Discussed return precautions.  Recommended that he follow-up with hand surgery soon as possible.  His questions were answered and he was amicable at the time of discharge.  Note: Portions of this report may have been transcribed using voice recognition software. Every effort was made to ensure accuracy; however, inadvertent computerized transcription errors may be present.   Final Clinical Impression(s) / ED Diagnoses Final diagnoses:  Laceration of left index finger without foreign body without damage to nail, initial encounter  Closed displaced fracture of distal phalanx of left index finger, initial encounter   Rx / DC Orders ED Discharge Orders          Ordered    cephALEXin (KEFLEX) 500 MG capsule  4 times daily        05/01/21 0052              Rayna Sexton, PA-C 05/01/21 0105    Merryl Hacker, MD 05/01/21 [phone]

## 2021-07-27 ENCOUNTER — Encounter (HOSPITAL_COMMUNITY): Payer: Self-pay | Admitting: Emergency Medicine

## 2021-07-27 ENCOUNTER — Emergency Department (HOSPITAL_COMMUNITY)
Admission: EM | Admit: 2021-07-27 | Discharge: 2021-07-27 | Disposition: A | Discharge status: Home / Self Care | Payer: Self-pay | Attending: Emergency Medicine | Admitting: Emergency Medicine

## 2021-07-27 ENCOUNTER — Emergency Department (HOSPITAL_COMMUNITY): Payer: Self-pay

## 2021-07-27 ENCOUNTER — Other Ambulatory Visit: Payer: Self-pay

## 2021-07-27 DIAGNOSIS — W293XXA Contact with powered garden and outdoor hand tools and machinery, initial encounter: Secondary | ICD-10-CM | POA: Insufficient documentation

## 2021-07-27 DIAGNOSIS — S81022A Laceration with foreign body, left knee, initial encounter: Secondary | ICD-10-CM | POA: Insufficient documentation

## 2021-07-27 DIAGNOSIS — S81012A Laceration without foreign body, left knee, initial encounter: Secondary | ICD-10-CM

## 2021-07-27 MED ORDER — OXYCODONE-ACETAMINOPHEN 5-325 MG PO TABS
1.0000 | ORAL_TABLET | Freq: Once | ORAL | Status: AC
  Administered 2021-07-27: 1 via ORAL
  Filled 2021-07-27: qty 1

## 2021-07-27 MED ORDER — CEPHALEXIN 500 MG PO CAPS: 500.0000 mg | ORAL_CAPSULE | Freq: Four times a day (QID) | ORAL | 0 refills | Status: DC

## 2021-07-27 MED ORDER — LIDOCAINE-EPINEPHRINE (PF) 2 %-1:200000 IJ SOLN
10.0000 mL | Freq: Once | INTRAMUSCULAR | Status: AC
  Administered 2021-07-27: 10 mL
  Filled 2021-07-27: qty 20

## 2021-07-27 MED ORDER — CEFAZOLIN SODIUM-DEXTROSE 2-4 GM/100ML-% IV SOLN
2.0000 g | Freq: Once | INTRAVENOUS | Status: AC
  Administered 2021-07-27: 2 g via INTRAVENOUS
  Filled 2021-07-27: qty 100

## 2021-07-27 NOTE — Discharge Instructions (Addendum)
You may put weight on the left foot while wearing the knee immobilizer.   ? ?Leave the dressing in place until you follow up in the office next Friday.   ? ?Call [facility] at [phone] Monday to schedule an appointment to be seen by Dr. Doran Durand. ? ?I am prescribing you an antibiotic called Keflex.  Please take this 4 times per day for the next 5 days to prevent infection of the leg.  Please complete the full course of these antibiotics.  Do not stop them early. ?

## 2021-07-27 NOTE — ED Provider Notes (Signed)
?[facility] ?Provider Note ? ? ?CSN: [id number] ?Arrival date & time: 07/27/21  1647 ? ?  ? ?History ? ?Chief Complaint  ?Patient presents with  ? chainsaw lac   ? ? ?[name] is a 66 y.o. male. ? ?HPI ?Patient is a 66 year old male who presents to the emergency department due to a left knee laceration.  States this occurred about 45 minutes prior to arrival.  He was testing out a chain saw in his backyard and the chainsaw slipped on a long and struck the left knee.  He reports a large laceration in the region with moderate pain as well as mild bleeding.  No numbness or weakness.  States his Tdap is up-to-date. ?  ? ?Home Medications ?Prior to Admission medications   ?Medication Sig Start Date End Date Taking? Authorizing Provider  ?cephALEXin (KEFLEX) 500 MG capsule Take 1 capsule (500 mg total) by mouth 4 (four) times daily. 07/27/21  Yes Rayna Sexton, PA-C  ?atorvastatin (LIPITOR) 10 MG tablet Take 1 tablet (10 mg total) by mouth daily. 12/18/18   Saguier, Percell Miller, PA-C  ?CINNAMON PO Take 2 capsules by mouth daily.    [provider]  ?cyclobenzaprine (FLEXERIL) 10 MG tablet 1 tab po prior to sleep 06/15/19   Saguier, Percell Miller, PA-C  ?GARLIC PO Take 2 capsules by mouth daily.    [provider]  ?glyBURIDE (DIABETA) 5 MG tablet Take 1 tablet (5 mg total) by mouth daily with breakfast. 12/31/18   Saguier, Percell Miller, PA-C  ?HYDROcodone-acetaminophen (NORCO) 5-325 MG tablet Take 1 tablet by mouth every 6 (six) hours as needed for moderate pain. 06/15/19   Saguier, Percell Miller, PA-C  ?lisinopril-hydrochlorothiazide (ZESTORETIC) 10-12.5 MG tablet Take 1 tablet by mouth daily. 01/11/19   Saguier, Percell Miller, PA-C  ?meloxicam (MOBIC) 7.5 MG tablet Take 1 tablet (7.5 mg total) by mouth daily. 12/31/18   Saguier, Percell Miller, PA-C  ?meloxicam (MOBIC) 7.5 MG tablet Take 1 tablet (7.5 mg total) by mouth daily. 06/15/19   Saguier, Percell Miller, PA-C  ?metFORMIN (GLUCOPHAGE) 1000 MG tablet Take  1 tablet (1,000 mg total) by mouth 2 (two) times daily with a meal. 12/31/18   Saguier, Percell Miller, PA-C  ?MICROLET LANCETS MISC Pt uses microlet lancets for his microlet next lancet device. 06/18/15   Henson, Vickie L, NP-C  ?sitaGLIPtin (JANUVIA) 50 MG tablet Take 1 tablet (50 mg total) by mouth daily. 06/15/19   Saguier, Percell Miller, PA-C  ?   ? ?Allergies    ?Patient has no known allergies.   ? ?Review of Systems   ?Review of Systems  ?Musculoskeletal:  Positive for arthralgias and myalgias.  ?Skin:  Positive for wound.  ?Neurological:  Negative for weakness and numbness.  ? ?Physical Exam ?Updated Vital Signs ?BP (!) 154/96   Pulse 74   Temp 98.5 ?F (36.9 ?C) (Oral)   Resp 17   SpO2 99%  ?Physical Exam ?Vitals and nursing note reviewed.  ?Constitutional:   ?   General: He is not in acute distress. ?   Appearance: He is well-developed.  ?HENT:  ?   Head: Normocephalic and atraumatic.  ?   Right Ear: External ear normal.  ?   Left Ear: External ear normal.  ?Eyes:  ?   General: No scleral icterus.    ?   Right eye: No discharge.     ?   Left eye: No discharge.  ?   Conjunctiva/sclera: Conjunctivae normal.  ?Neck:  ?   Trachea: No tracheal deviation.  ?  Cardiovascular:  ?   Rate and Rhythm: Normal rate.  ?Pulmonary:  ?   Effort: Pulmonary effort is normal. No respiratory distress.  ?   Breath sounds: No stridor.  ?Abdominal:  ?   General: Abdomen is flat. There is no distension.  ?Musculoskeletal:     ?   General: No swelling or deformity.  ?   Cervical back: Neck supple.  ?Skin: ?   General: Skin is warm and dry.  ?   Findings: No rash.  ?   Comments: Please see image below of the left knee. Large gaping linear laceration overlying the left knee.  Mild active bleeding that resolves with direct pressure.  Full active and passive range of motion of the knee.  Distal sensation intact.  Palpable pedal pulses.  ?Neurological:  ?   Mental Status: He is alert.  ?   Cranial Nerves: Cranial nerve deficit: no gross deficits.   ? ? ? ? ?ED Results / Procedures / Treatments   ?Labs ?(all labs ordered are listed, but only abnormal results are displayed) ?Labs Reviewed - No data to display ? ?EKG ?None ? ?Radiology ?DG Knee Complete 4 Views Left ? ?Result Date: 07/27/2021 ?CLINICAL DATA:  Laceration to LEFT knee. EXAM: LEFT KNEE - COMPLETE 4+ VIEW COMPARISON:  None Available. FINDINGS: A anterior soft tissue defect just above the patella is noted. A 5 mm bony density just above the patella does not appear acute but fracture of the SUPERIOR patellar osteophyte is difficult to exclude. A 1 mm metallic foreign body is noted along the anterior aspect of the soft tissue injury/laceration. There is no evidence of fracture or dislocation. IMPRESSION: 1. Anterior soft tissue injury with 1 mm metallic foreign body along the anterior aspect of the soft tissue injury/laceration. 2. 5 mm bony density just above the patella does not appear acute but fracture of the SUPERIOR patellar osteophyte is difficult to exclude. Electronically Signed   By: Margarette Canada M.D.   On: 07/27/2021 18:32   ? ?Procedures ?Marland Kitchen.Laceration Repair ? ?Date/Time: 07/27/2021 8:23 PM ?Performed by: Rayna Sexton, PA-C ?Authorized by: Rayna Sexton, PA-C  ? ?Consent:  ?  Consent obtained:  Verbal ?  Consent given by:  Patient ?  Risks discussed:  Infection, need for additional repair, pain, poor cosmetic result and poor wound healing ?  Alternatives discussed:  No treatment and delayed treatment ?Universal protocol:  ?  Procedure explained and questions answered to patient or proxy's satisfaction: yes   ?  Relevant documents present and verified: yes   ?  Test results available: yes   ?  Imaging studies available: yes   ?  Required blood products, implants, devices, and special equipment available: yes   ?  Site/side marked: yes   ?  Immediately prior to procedure, a time out was called: yes   ?  Patient identity confirmed:  Verbally with patient ?Anesthesia:  ?  Anesthesia method:   Local infiltration ?  Local anesthetic:  Lidocaine 2% WITH epi ?Laceration details:  ?  Location: left knee. ?  Length (cm):  8 ?Pre-procedure details:  ?  Preparation:  Patient was prepped and draped in usual sterile fashion ?Exploration:  ?  Limited defect created (wound extended): no   ?  Hemostasis achieved with:  Direct pressure ?  Imaging obtained: x-ray   ?  Imaging outcome: foreign body noted   ?  Wound exploration: wound explored through full range of motion   ?  Wound extent:  foreign bodies/material and tendon damage   ?  Foreign bodies/material:  Loose string from patients pants ?  Tendon damage location:  Lower extremity ?  Tendon damage extent:  Partial transection ?  Tendon repair plan:  Refer for evaluation ?  Contaminated: yes   ?Treatment:  ?  Area cleansed with:  Povidone-iodine and saline ?  Amount of cleaning:  Extensive ?  Irrigation solution:  Sterile saline ?  Irrigation volume:  4 liters ?  Irrigation method:  Pressure wash ?  Visualized foreign bodies/material removed: yes   ?  Debridement:  None ?Skin repair:  ?  Repair method:  Sutures ?  Suture size:  4-0 ?  Suture material:  Prolene ?  Suture technique: 3 horizontal mattress and 9 simple interrupted. ?Approximation:  ?  Approximation:  Close ?Repair type:  ?  Repair type:  Complex ?Post-procedure details:  ?  Dressing:  Sterile dressing and splint for protection ?  Procedure completion:  Tolerated well, no immediate complications  ? ?Medications Ordered in ED ?Medications  ?lidocaine-EPINEPHrine (XYLOCAINE W/EPI) 2 %-1:200000 (PF) injection 10 mL (10 mLs Infiltration Given 07/27/21 1911)  ?oxyCODONE-acetaminophen (PERCOCET/ROXICET) 5-325 MG per tablet 1 tablet (1 tablet Oral Given 07/27/21 1744)  ?ceFAZolin (ANCEF) IVPB 2g/100 mL premix (0 g Intravenous Stopped 07/27/21 2206)  ? ?ED Course/ Medical Decision Making/ A&P ?Clinical Course as of 07/27/21 2305  ?Sat Jul 27, 2021  ?2020 Patient discussed with and evaluated by Dr. Wylene Simmer with  orthopedic surgery.  Discussed wound closure at length.  Recommends that wound be wrapped with Kerlix and 4 x 4's after closure and patient placed in a knee immobilizer.  Recommends outpatient follow-up in his o

## 2021-07-27 NOTE — ED Notes (Signed)
Patient with large laceration to the left knee. Bleeding controlled. ?

## 2021-07-27 NOTE — Consult Note (Signed)
Reason for Consult:  left thigh laceration ?Referring Physician: Dr. Ronnald Nian ? ?[name] is an 66 y.o. male.  ?HPI: 66 year old male with a past medical history significant for diabetes and smoking injured his left thigh earlier this afternoon.  He was using a chainsaw to cut down a small tree.  He had long pants on.  The sawblade kicked back and struck the distal anterior aspect of his left thigh just above the knee.  He cut his pants away and dressed the wound.  He presented to the emergency room.  He was able to bear weight on the left lower extremity.  He denies any history of injury or surgery to that knee in the past. ? ?Past Medical History:  ?Diagnosis Date  ? Arthritis   ? Blood transfusion without reported diagnosis   ? 30 yrs ago  ? Chronic low back pain   ? Diabetes ([place])   ? Hypertension   ? Insomnia   ? Muscle cramps   ? Skin fissure   ? Uncontrolled type 2 diabetes mellitus without complication, without long-term current use of insulin 01/24/2015  ? ? ?Past Surgical History:  ?Procedure Laterality Date  ? CHOLECYSTECTOMY    ? EYE MUSCLE SURGERY    ? bilateral  ? FINGER SURGERY    ? right hand  ? TOE AMPUTATION    ? left  ? ? ?Family History  ?Problem Relation Age of Onset  ? Seizures Father 34  ?     broken hip  ? Diabetes Sister   ? Hypertension Sister   ? Diabetes Sister   ? Diabetes Sister   ? Colon cancer Neg Hx   ? ? ?Social History:  reports that he has been smoking cigarettes. He has a 30.00 pack-year smoking history. He has never used smokeless tobacco. He reports current alcohol use. He reports that he does not use drugs. ? ?Allergies: No Known Allergies ? ?Medications: I have reviewed the patient's current medications. ? ?No results found for this or any previous visit (from the past 48 hour(s)). ? ?DG Knee Complete 4 Views Left ? ?Result Date: 07/27/2021 ?CLINICAL DATA:  Laceration to LEFT knee. EXAM: LEFT KNEE - COMPLETE 4+ VIEW COMPARISON:  None Available. FINDINGS: A anterior soft  tissue defect just above the patella is noted. A 5 mm bony density just above the patella does not appear acute but fracture of the SUPERIOR patellar osteophyte is difficult to exclude. A 1 mm metallic foreign body is noted along the anterior aspect of the soft tissue injury/laceration. There is no evidence of fracture or dislocation. IMPRESSION: 1. Anterior soft tissue injury with 1 mm metallic foreign body along the anterior aspect of the soft tissue injury/laceration. 2. 5 mm bony density just above the patella does not appear acute but fracture of the SUPERIOR patellar osteophyte is difficult to exclude. Electronically Signed   By: Margarette Canada M.D.   On: 07/27/2021 18:32   ? ?ROS: No recent fever, chills, nausea, vomiting or changes in his appetite.  10 system review was otherwise negative. ?PE:  ?Blood pressure (!) 150/87, pulse 75, temperature 98.7 ?F (37.1 ?C), temperature source Oral, resp. rate 16, SpO2 98 %. ?Well-nourished well-developed man in no apparent distress.  Alert and oriented x4.  Normal mood and affect.  The left lower extremity has no gross deformity.  There is a transverse laceration at the distal anterior thigh a few centimeters proximal from the superior pole of the patella.  The laceration is  about 8 cm from medial to lateral.  It extends down through the subcutaneous tissue and just into the quad tendon.  There is no exposed bone.  There is no gross contamination.  He can actively fire the quad and extend his knee with 5 out of 5 strength.  Intact sensibility to light touch at the anterior knee distal to the laceration.  No lymphadenopathy or lymphangitis. ? ?Assessment/Plan: ?Left thigh laceration -this wound can be safely managed in the emergency department.  I spoke with the physician assistant, Rayna Sexton, about irrigation of the wound and layered closure.  The patient will be discharged with a knee immobilizer and can bear weight as tolerated.  Follow-up with me in the office in  6 days for a wound check.  IV cefazolin here in the emergency room.  The patient understands the plan and agrees. ? ?Wylene Simmer ?07/27/2021, 8:02 PM  ? ? ?  ?

## 2021-07-27 NOTE — ED Triage Notes (Signed)
Large L knee laceration from a chainsaw approx 45 min ago. Pt's dishtowel and duct tape removed on arrival and pressure bandage placed. ?

## 2023-07-13 ENCOUNTER — Ambulatory Visit: Payer: Self-pay | Admitting: Orthopedic Surgery

## 2023-07-13 ENCOUNTER — Other Ambulatory Visit (INDEPENDENT_AMBULATORY_CARE_PROVIDER_SITE_OTHER): Payer: Self-pay

## 2023-07-13 DIAGNOSIS — M25562 Pain in left knee: Secondary | ICD-10-CM

## 2023-07-17 ENCOUNTER — Encounter: Payer: Self-pay | Admitting: Orthopedic Surgery

## 2023-07-17 NOTE — Progress Notes (Signed)
 Office Visit Note   Patient: [name]           Date of Birth: 03/07/1956           MRN: [id number] Visit Date: 07/13/2023 Requested by: Ulysees Gander, MD [address] [place],  [place] [address] PCP: Pcp, No  Subjective: Chief Complaint  Patient presents with   Left Knee - Pain    HPI: [name] is a 68 y.o. male who presents to the office reporting left knee pain of 2 years duration.  Had a fall in his backyard 2 years ago and then he actually had a chainsaw injury to the soft tissue of that left knee shortly thereafter.  Had prior treatment at [facility] he told him he had a meniscal injury.  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  Pain is mostly on the medial side..   Did have an MRI scan done in 2024 which showed a chronic degenerative medial meniscal tear with cyst.              ROS: All systems reviewed are negative as they relate to the chief complaint within the history of present illness.  Patient denies fevers or chills.  Assessment & Plan: Visit Diagnoses:  1. Left knee pain, unspecified chronicity     Plan: Impression is relatively asymptomatic medial meniscal tear in the knee.  We talked about different interventions today including arthroscopic surgery/injection/observation.  He does have full range of motion today in a stable knee.  He did get a prescription of Norco from his primary care provider to help with back pain.  We will hold off on intervention for now and see if this becomes less symptomatic.  He is not really reporting much in the way of mechanical symptoms.  Radiographs do not show any joint space narrowing which is on the positive side for [place].  Follow-Up Instructions: No follow-ups on file.   Orders:  Orders Placed This Encounter  Procedures   XR KNEE 3 VIEW LEFT   No orders of the defined types were placed in this encounter.     Procedures: No procedures performed   Clinical Data: No additional  findings.  Objective: Vital Signs: There were no vitals taken for this visit.  Physical Exam:  Constitutional: Patient appears well-developed HEENT:  Head: Normocephalic Eyes:EOM are normal Neck: Normal range of motion Cardiovascular: Normal rate Pulmonary/chest: Effort normal Neurologic: Patient is alert Skin: Skin is warm Psychiatric: Patient has normal mood and affect  Ortho Exam: Ortho exam demonstrates normal gait and alignment.  Full range of motion of that left knee is present.  Does have a transverse laceration across the quad region from his chainsaw injury.  Extensor mechanism intact and nontender.  McMurray compression testing negative on both sides.  Pedal pulses palpable.  No groin pain with internal or external rotation of the leg.  Specialty Comments:  No specialty comments available.  Imaging: No results found.   PMFS History: Patient Active Problem List   Diagnosis Date Noted   Tinnitus of left ear 10/03/2015   History of amputation of lesser toe (HCC) 10/03/2015   Decreased hearing of left ear 10/03/2015   TM (tympanic membrane disorder) 10/03/2015   Essential hypertension 01/24/2015   Smoker 01/24/2015   Alcohol abuse, daily use 01/24/2015   Anal fistula 03/07/2013   Past Medical History:  Diagnosis Date   Arthritis    Blood transfusion without reported diagnosis    30  yrs ago   Chronic low back pain    Diabetes (HCC)    Hypertension    Insomnia    Muscle cramps    Skin fissure    Uncontrolled type 2 diabetes mellitus without complication, without long-term current use of insulin 01/24/2015    Family History  Problem Relation Age of Onset   Seizures Father 30       broken hip   Diabetes Sister    Hypertension Sister    Diabetes Sister    Diabetes Sister    Colon cancer Neg Hx     Past Surgical History:  Procedure Laterality Date   CHOLECYSTECTOMY     EYE MUSCLE SURGERY     bilateral   FINGER SURGERY     right hand   TOE AMPUTATION      left   Social History   Occupational History   Occupation: truck Sports administrator: [employer]  Tobacco Use   Smoking status: Every Day    Current packs/day: 1.00    Average packs/day: 1 pack/day for 30.0 years (30.0 ttl pk-yrs)    Types: Cigarettes   Smokeless tobacco: Never  Substance and Sexual Activity   Alcohol use: Yes    Comment: 2 24oz cans of beer twice a day   Drug use: No   Sexual activity: Not on file

## 2023-08-28 ENCOUNTER — Inpatient Hospital Stay (HOSPITAL_BASED_OUTPATIENT_CLINIC_OR_DEPARTMENT_OTHER)
Admission: EM | Admit: 2023-08-28 | Discharge: 2023-09-02 | DRG: 168 | Disposition: A | Discharge status: Home / Self Care | Attending: Internal Medicine | Admitting: Internal Medicine

## 2023-08-28 ENCOUNTER — Emergency Department (HOSPITAL_BASED_OUTPATIENT_CLINIC_OR_DEPARTMENT_OTHER)

## 2023-08-28 ENCOUNTER — Other Ambulatory Visit: Payer: Self-pay

## 2023-08-28 ENCOUNTER — Encounter (HOSPITAL_BASED_OUTPATIENT_CLINIC_OR_DEPARTMENT_OTHER): Payer: Self-pay

## 2023-08-28 DIAGNOSIS — E860 Dehydration: Secondary | ICD-10-CM | POA: Diagnosis present

## 2023-08-28 DIAGNOSIS — Z91041 Radiographic dye allergy status: Secondary | ICD-10-CM | POA: Diagnosis not present

## 2023-08-28 DIAGNOSIS — I10 Essential (primary) hypertension: Secondary | ICD-10-CM | POA: Diagnosis present

## 2023-08-28 DIAGNOSIS — F1010 Alcohol abuse, uncomplicated: Secondary | ICD-10-CM | POA: Diagnosis present

## 2023-08-28 DIAGNOSIS — E1165 Type 2 diabetes mellitus with hyperglycemia: Secondary | ICD-10-CM | POA: Diagnosis present

## 2023-08-28 DIAGNOSIS — F17210 Nicotine dependence, cigarettes, uncomplicated: Secondary | ICD-10-CM | POA: Diagnosis present

## 2023-08-28 DIAGNOSIS — Z1152 Encounter for screening for COVID-19: Secondary | ICD-10-CM | POA: Diagnosis not present

## 2023-08-28 DIAGNOSIS — K219 Gastro-esophageal reflux disease without esophagitis: Secondary | ICD-10-CM | POA: Diagnosis present

## 2023-08-28 DIAGNOSIS — Z8249 Family history of ischemic heart disease and other diseases of the circulatory system: Secondary | ICD-10-CM | POA: Diagnosis not present

## 2023-08-28 DIAGNOSIS — Z833 Family history of diabetes mellitus: Secondary | ICD-10-CM

## 2023-08-28 DIAGNOSIS — J852 Abscess of lung without pneumonia: Secondary | ICD-10-CM

## 2023-08-28 DIAGNOSIS — Z79899 Other long term (current) drug therapy: Secondary | ICD-10-CM

## 2023-08-28 DIAGNOSIS — F17200 Nicotine dependence, unspecified, uncomplicated: Secondary | ICD-10-CM | POA: Diagnosis present

## 2023-08-28 DIAGNOSIS — R918 Other nonspecific abnormal finding of lung field: Secondary | ICD-10-CM | POA: Diagnosis present

## 2023-08-28 DIAGNOSIS — E119 Type 2 diabetes mellitus without complications: Secondary | ICD-10-CM | POA: Diagnosis not present

## 2023-08-28 DIAGNOSIS — Z794 Long term (current) use of insulin: Secondary | ICD-10-CM | POA: Diagnosis not present

## 2023-08-28 DIAGNOSIS — R509 Fever, unspecified: Secondary | ICD-10-CM | POA: Diagnosis not present

## 2023-08-28 DIAGNOSIS — Z7984 Long term (current) use of oral hypoglycemic drugs: Secondary | ICD-10-CM

## 2023-08-28 DIAGNOSIS — J189 Pneumonia, unspecified organism: Secondary | ICD-10-CM | POA: Diagnosis present

## 2023-08-28 DIAGNOSIS — Z791 Long term (current) use of non-steroidal anti-inflammatories (NSAID): Secondary | ICD-10-CM

## 2023-08-28 DIAGNOSIS — E785 Hyperlipidemia, unspecified: Secondary | ICD-10-CM | POA: Diagnosis not present

## 2023-08-28 LAB — CBC WITH DIFFERENTIAL/PLATELET
Abs Immature Granulocytes: 0.03 10*3/uL (ref 0.00–0.07)
Basophils Absolute: 0 10*3/uL (ref 0.0–0.1)
Basophils Relative: 0 %
Eosinophils Absolute: 0.1 10*3/uL (ref 0.0–0.5)
Eosinophils Relative: 1 %
HCT: 39.4 % (ref 39.0–52.0)
Hemoglobin: 13.2 g/dL (ref 13.0–17.0)
Immature Granulocytes: 0 %
Lymphocytes Relative: 16 %
Lymphs Abs: 1.5 10*3/uL (ref 0.7–4.0)
MCH: 30.3 pg (ref 26.0–34.0)
MCHC: 33.5 g/dL (ref 30.0–36.0)
MCV: 90.6 fL (ref 80.0–100.0)
Monocytes Absolute: 1.1 10*3/uL — ABNORMAL HIGH (ref 0.1–1.0)
Monocytes Relative: 11 %
Neutro Abs: 6.8 10*3/uL (ref 1.7–7.7)
Neutrophils Relative %: 72 %
Platelets: 214 10*3/uL (ref 150–400)
RBC: 4.35 MIL/uL (ref 4.22–5.81)
RDW: 13.5 % (ref 11.5–15.5)
WBC: 9.5 10*3/uL (ref 4.0–10.5)
nRBC: 0 % (ref 0.0–0.2)

## 2023-08-28 LAB — URINALYSIS, ROUTINE W REFLEX MICROSCOPIC
Bacteria, UA: NONE SEEN
Bilirubin Urine: NEGATIVE
Glucose, UA: >1000 mg/dL — AB
Hgb urine dipstick: NEGATIVE
Ketones, ur: 40 mg/dL — AB
Leukocytes,Ua: NEGATIVE
Nitrite: NEGATIVE
Protein, ur: NEGATIVE mg/dL
Specific Gravity, Urine: 1.033 — ABNORMAL HIGH (ref 1.005–1.030)
pH: 6 (ref 5.0–8.0)

## 2023-08-28 LAB — COMPREHENSIVE METABOLIC PANEL WITH GFR
ALT: 17 U/L (ref 0–44)
AST: 15 U/L (ref 15–41)
Albumin: 4.1 g/dL (ref 3.5–5.0)
Alkaline Phosphatase: 58 U/L (ref 38–126)
Anion gap: 15 (ref 5–15)
BUN: 11 mg/dL (ref 8–23)
CO2: 26 mmol/L (ref 22–32)
Calcium: 10.5 mg/dL — ABNORMAL HIGH (ref 8.9–10.3)
Chloride: 96 mmol/L — ABNORMAL LOW (ref 98–111)
Creatinine, Ser: 1.07 mg/dL (ref 0.61–1.24)
GFR, Estimated: >60 mL/min (ref >60)
Glucose, Bld: 288 mg/dL — ABNORMAL HIGH (ref 70–99)
Potassium: 4.7 mmol/L (ref 3.5–5.1)
Sodium: 137 mmol/L (ref 135–145)
Total Bilirubin: 0.5 mg/dL (ref 0.0–1.2)
Total Protein: 7.6 g/dL (ref 6.5–8.1)

## 2023-08-28 LAB — LACTIC ACID, PLASMA
Lactic Acid, Venous: 1.1 mmol/L (ref 0.5–1.9)
Lactic Acid, Venous: 1.1 mmol/L (ref 0.5–1.9)

## 2023-08-28 LAB — RESP PANEL BY RT-PCR (RSV, FLU A&B, COVID)  RVPGX2
Influenza A by PCR: NEGATIVE
Influenza B by PCR: NEGATIVE
Resp Syncytial Virus by PCR: NEGATIVE
SARS Coronavirus 2 by RT PCR: NEGATIVE

## 2023-08-28 LAB — LIPASE, BLOOD: Lipase: 27 U/L (ref 11–51)

## 2023-08-28 LAB — GLUCOSE, CAPILLARY: Glucose-Capillary: 265 mg/dL — ABNORMAL HIGH (ref 70–99)

## 2023-08-28 MED ORDER — INSULIN ASPART 100 UNIT/ML IJ SOLN: 0.0000 [IU] | Freq: Three times a day (TID) | INTRAMUSCULAR | Status: DC

## 2023-08-28 MED ORDER — INSULIN ASPART 100 UNIT/ML IJ SOLN
0.0000 [IU] | Freq: Three times a day (TID) | INTRAMUSCULAR | Status: DC
  Administered 2023-08-28: 8 [IU] via SUBCUTANEOUS
  Administered 2023-08-29: 11 [IU] via SUBCUTANEOUS
  Administered 2023-08-29 – 2023-08-30 (×3): 8 [IU] via SUBCUTANEOUS
  Administered 2023-08-30: 15 [IU] via SUBCUTANEOUS
  Administered 2023-08-30: 8 [IU] via SUBCUTANEOUS
  Administered 2023-08-30: 11 [IU] via SUBCUTANEOUS
  Administered 2023-08-30: 8 [IU] via SUBCUTANEOUS
  Administered 2023-08-31: 11 [IU] via SUBCUTANEOUS
  Administered 2023-08-31: 3 [IU] via SUBCUTANEOUS
  Administered 2023-08-31: 5 [IU] via SUBCUTANEOUS
  Administered 2023-08-31: 15 [IU] via SUBCUTANEOUS
  Administered 2023-09-01: 5 [IU] via SUBCUTANEOUS
  Administered 2023-09-01: 8 [IU] via SUBCUTANEOUS
  Administered 2023-09-01 – 2023-09-02 (×3): 5 [IU] via SUBCUTANEOUS

## 2023-08-28 MED ORDER — SODIUM CHLORIDE 0.9 % IV SOLN
3.0000 g | Freq: Once | INTRAVENOUS | Status: AC
  Administered 2023-08-28: 3 g via INTRAVENOUS

## 2023-08-28 MED ORDER — ATORVASTATIN CALCIUM 10 MG PO TABS
10.0000 mg | ORAL_TABLET | Freq: Every day | ORAL | Status: DC
  Administered 2023-08-28 – 2023-09-02 (×6): 10 mg via ORAL
  Filled 2023-08-28 (×6): qty 1

## 2023-08-28 MED ORDER — ENOXAPARIN SODIUM 40 MG/0.4ML IJ SOSY
40.0000 mg | PREFILLED_SYRINGE | INTRAMUSCULAR | Status: DC
Start: 2023-08-28 — End: 2023-08-29
  Administered 2023-08-28: 40 mg via SUBCUTANEOUS
  Filled 2023-08-28: qty 0.4

## 2023-08-28 MED ORDER — SODIUM CHLORIDE 0.9 % IV SOLN
500.0000 mg | Freq: Once | INTRAVENOUS | Status: AC
  Administered 2023-08-28: 500 mg via INTRAVENOUS
  Filled 2023-08-28: qty 5

## 2023-08-28 MED ORDER — SODIUM CHLORIDE 0.9 % IV SOLN
1.0000 g | Freq: Once | INTRAVENOUS | Status: AC
  Administered 2023-08-28: 1 g via INTRAVENOUS
  Filled 2023-08-28: qty 10

## 2023-08-28 MED ORDER — SODIUM CHLORIDE 0.9 % IV SOLN
3.0000 g | Freq: Four times a day (QID) | INTRAVENOUS | Status: DC
  Administered 2023-08-28 – 2023-09-01 (×14): 3 g via INTRAVENOUS
  Filled 2023-08-28 (×17): qty 8

## 2023-08-28 MED ORDER — ACETAMINOPHEN 500 MG PO TABS
1000.0000 mg | ORAL_TABLET | Freq: Once | ORAL | Status: AC
  Administered 2023-08-28: 1000 mg via ORAL
  Filled 2023-08-28: qty 2

## 2023-08-28 MED ORDER — SODIUM CHLORIDE 0.9 % IV SOLN: INTRAVENOUS | Status: DC

## 2023-08-28 MED ORDER — ACETAMINOPHEN 325 MG PO TABS
650.0000 mg | ORAL_TABLET | Freq: Four times a day (QID) | ORAL | Status: DC | PRN
  Administered 2023-08-28 – 2023-08-29 (×3): 650 mg via ORAL
  Filled 2023-08-28 (×3): qty 2

## 2023-08-28 MED ORDER — IOHEXOL 300 MG/ML  SOLN: 100.0000 mL | Freq: Once | INTRAMUSCULAR | Status: DC | PRN

## 2023-08-28 NOTE — ED Triage Notes (Signed)
 C/o generalized ABD pain and chills x "over a month" States has had multiple work up over the last month at PCP but "they can't find anything"

## 2023-08-28 NOTE — ED Provider Notes (Addendum)
 [place] EMERGENCY DEPARTMENT AT [facility] Provider Note   CSN: [id number] Arrival date & time: 08/28/23  4098     History  Chief Complaint  Patient presents with   Abdominal Pain    [name] is a 68 y.o. male.  Patient with a complaint of fever and chills on and off for the past month.  Worse the past 3 to 4 days.  Associated with headache occasional abdominal pain.  Does have a headache currently no neck stiffness no nausea vomiting or diarrhea.  No dysuria.  No unusual rash.  Patient's been seen at [facility] primary care with lab work without any significant findings to explain the fever chills.  Patient last had some Tylenol  at about 3 in the morning.  Patient does state that he has not had any CAT scans related to this illness.  Past medical history significant for diabetes hypertension chronic low back pain.  Left toe amputation gallbladder removal.  Patient is an everyday smoker.       Home Medications Prior to Admission medications   Medication Sig Start Date End Date Taking? Authorizing Provider  atorvastatin  (LIPITOR) 10 MG tablet Take 1 tablet (10 mg total) by mouth daily. 12/18/18   Saguier, Gaylin Ke, PA-C  cephALEXin  (KEFLEX ) 500 MG capsule Take 1 capsule (500 mg total) by mouth 4 (four) times daily. 07/27/21   Joldersma, Logan, PA-C  CINNAMON PO Take 2 capsules by mouth daily.    [provider]  cyclobenzaprine  (FLEXERIL ) 10 MG tablet 1 tab po prior to sleep 06/15/19   Saguier, Gaylin Ke, PA-C  GARLIC PO Take 2 capsules by mouth daily.    [provider]  glyBURIDE  (DIABETA ) 5 MG tablet Take 1 tablet (5 mg total) by mouth daily with breakfast. 12/31/18   Saguier, Gaylin Ke, PA-C  HYDROcodone -acetaminophen  (NORCO) 5-325 MG tablet Take 1 tablet by mouth every 6 (six) hours as needed for moderate pain. 06/15/19   Saguier, Gaylin Ke, PA-C  lisinopril -hydrochlorothiazide  (ZESTORETIC ) 10-12.5 MG tablet Take 1 tablet by mouth daily. 01/11/19   Saguier,  Gaylin Ke, PA-C  meloxicam  (MOBIC ) 7.5 MG tablet Take 1 tablet (7.5 mg total) by mouth daily. 12/31/18   Saguier, Gaylin Ke, PA-C  meloxicam  (MOBIC ) 7.5 MG tablet Take 1 tablet (7.5 mg total) by mouth daily. 06/15/19   Saguier, Gaylin Ke, PA-C  metFORMIN  (GLUCOPHAGE ) 1000 MG tablet Take 1 tablet (1,000 mg total) by mouth 2 (two) times daily with a meal. 12/31/18   Saguier, Gaylin Ke, PA-C  MICROLET LANCETS MISC Pt uses microlet lancets for his microlet next lancet device. 06/18/15   Henson, Vickie L, NP-C  sitaGLIPtin  (JANUVIA ) 50 MG tablet Take 1 tablet (50 mg total) by mouth daily. 06/15/19   Saguier, Gaylin Ke, PA-C      Allergies    Patient has no known allergies.    Review of Systems   Review of Systems  Constitutional:  Positive for chills and fever.  HENT:  Negative for ear pain and sore throat.   Eyes:  Negative for pain and visual disturbance.  Respiratory:  Negative for cough and shortness of breath.   Cardiovascular:  Negative for chest pain and palpitations.  Gastrointestinal:  Positive for abdominal pain. Negative for diarrhea, nausea and vomiting.  Genitourinary:  Negative for dysuria, flank pain and hematuria.  Musculoskeletal:  Negative for arthralgias, back pain and neck stiffness.  Skin:  Negative for color change and rash.  Neurological:  Positive for headaches. Negative for seizures and syncope.  All other systems reviewed and are  negative.   Physical Exam Updated Vital Signs BP (!) 153/80 (BP Location: Right Arm)   Pulse 85   Temp 100.1 F (37.8 C) (Oral)   Resp 18   SpO2 92%  Physical Exam Vitals and nursing note reviewed.  Constitutional:      General: He is not in acute distress.    Appearance: Normal appearance. He is well-developed. He is not ill-appearing.  HENT:     Head: Normocephalic and atraumatic.  Eyes:     Extraocular Movements: Extraocular movements intact.     Conjunctiva/sclera: Conjunctivae normal.     Pupils: Pupils are equal, round, and reactive to  light.  Cardiovascular:     Rate and Rhythm: Normal rate and regular rhythm.     Heart sounds: No murmur heard. Pulmonary:     Effort: Pulmonary effort is normal. No respiratory distress.     Breath sounds: Normal breath sounds.  Abdominal:     Palpations: Abdomen is soft.     Tenderness: There is no abdominal tenderness.  Musculoskeletal:        General: No swelling.     Cervical back: Normal range of motion and neck supple. No rigidity.     Right lower leg: No edema.     Left lower leg: No edema.  Skin:    General: Skin is warm and dry.     Capillary Refill: Capillary refill takes less than 2 seconds.  Neurological:     General: No focal deficit present.     Mental Status: He is alert and oriented to person, place, and time.  Psychiatric:        Mood and Affect: Mood normal.     ED Results / Procedures / Treatments   Labs (all labs ordered are listed, but only abnormal results are displayed) Labs Reviewed  CBC WITH DIFFERENTIAL/PLATELET - Abnormal; Notable for the following components:      Result Value   Monocytes Absolute 1.1 (*)    All other components within normal limits  COMPREHENSIVE METABOLIC PANEL WITH GFR - Abnormal; Notable for the following components:   Chloride 96 (*)    Glucose, Bld 288 (*)    Calcium  10.5 (*)    All other components within normal limits  URINALYSIS, ROUTINE W REFLEX MICROSCOPIC - Abnormal; Notable for the following components:   Specific Gravity, Urine 1.033 (*)    Glucose, UA >1,000 (*)    Ketones, ur 40 (*)    All other components within normal limits  CULTURE, BLOOD (ROUTINE X 2)  CULTURE, BLOOD (ROUTINE X 2)  LIPASE, BLOOD  LACTIC ACID, PLASMA  LACTIC ACID, PLASMA    EKG None  Radiology CT Head Wo Contrast Result Date: 08/28/2023 CLINICAL DATA:  Headache, new onset. EXAM: CT HEAD WITHOUT CONTRAST TECHNIQUE: Contiguous axial images were obtained from the base of the skull through the vertex without intravenous contrast.  RADIATION DOSE REDUCTION: This exam was performed according to the departmental dose-optimization program which includes automated exposure control, adjustment of the mA and/or kV according to patient size and/or use of iterative reconstruction technique. COMPARISON:  None Available. FINDINGS: Brain: No acute intracranial hemorrhage. No CT evidence of acute infarct. Nonspecific hypoattenuation in the periventricular and subcortical white matter favored to reflect chronic microvascular ischemic changes. No edema, mass effect, or midline shift. The basilar cisterns are patent. Ventricles: The ventricles are normal. Vascular: Atherosclerotic calcifications of the carotid siphons. No hyperdense vessel. Skull: No acute or aggressive finding. Orbits: Orbits are symmetric. Sinuses:  Mild mucosal thickening in the ethmoid sinuses. Other: Mastoid air cells are clear. IMPRESSION: No CT evidence of acute intracranial abnormality. Electronically Signed   By: Denny Flack M.D.   On: 08/28/2023 11:35   CT CHEST ABDOMEN PELVIS WO CONTRAST Result Date: 08/28/2023 CLINICAL DATA:  Sepsis, fever, chills * Tracking Code: BO * EXAM: CT CHEST, ABDOMEN AND PELVIS WITHOUT CONTRAST TECHNIQUE: Multidetector CT imaging of the chest, abdomen and pelvis was performed following the standard protocol without IV contrast. RADIATION DOSE REDUCTION: This exam was performed according to the departmental dose-optimization program which includes automated exposure control, adjustment of the mA and/or kV according to patient size and/or use of iterative reconstruction technique. COMPARISON:  None Available. FINDINGS: CT CHEST FINDINGS Cardiovascular: No significant vascular findings. Normal heart size. Three-vessel coronary artery calcifications. Enlargement of the main pulmonary artery measuring up to 3.8 cm in caliber. And no pericardial effusion. Mediastinum/Nodes: No enlarged mediastinal, hilar, or axillary lymph nodes. Thyroid gland, trachea, and  esophagus demonstrate no significant findings. Lungs/Pleura: Somewhat ill-defined heterogeneous and ground-glass consolidation in the medial right upper lobe with a more dense, solid appearing component medially abutting the pleura, overall dimensions 4.4 x 4.0 cm (series 4, image 42), most solid medial component 2.7 x 2.0 cm (series 3, image 17). No pleural effusion or pneumothorax. Musculoskeletal: No chest wall abnormality. No acute osseous findings. CT ABDOMEN PELVIS FINDINGS Hepatobiliary: No solid liver abnormality is seen. Hepatic steatosis. Status post cholecystectomy. No biliary dilatation. Possible dropped gallstones in the hepatorenal recess (series 3, image 67). Pancreas: Unremarkable. No pancreatic ductal dilatation or surrounding inflammatory changes. Spleen: Normal in size without significant abnormality. Adrenals/Urinary Tract: Adrenal glands are unremarkable. Kidneys are normal, without renal calculi, solid lesion, or hydronephrosis. Bladder is unremarkable. Stomach/Bowel: Stomach is within normal limits. Appendix appears normal. No evidence of bowel wall thickening, distention, or inflammatory changes. Vascular/Lymphatic: Aortic atherosclerosis. No enlarged abdominal or pelvic lymph nodes. Reproductive: No mass or other abnormality. Other: No abdominal wall hernia or abnormality. No ascites. Musculoskeletal: No acute osseous findings. IMPRESSION: 1. Somewhat ill-defined heterogeneous and ground-glass consolidation in the medial right upper lobe with a more dense, solid appearing component medially abutting the pleura, overall dimensions 4.4 x 4.0 cm, most solid medial component 2.7 x 2.0 cm. This is of uncertain nature, possibly infectious or inflammatory and possibly including a pulmonary abscess in the stated setting of intermittent fever and chills. Underlying solid mass not excluded. Contrast enhanced CT may be helpful for further assessment. 2. No noncontrast CT evidence of lymphadenopathy or  metastatic disease in the chest, abdomen, or pelvis. 3. Enlargement of the main pulmonary artery, as can be seen in pulmonary hypertension. 4. Coronary artery disease. 5. Hepatic steatosis. 6. Status post cholecystectomy. Aortic Atherosclerosis (ICD10-I70.0). Electronically Signed   By: Fredricka Jenny M.D.   On: 08/28/2023 11:19    Procedures Procedures    Medications Ordered in ED Medications  iohexol (OMNIPAQUE) 300 MG/ML solution 100 mL (has no administration in time range)  acetaminophen  (TYLENOL ) tablet 1,000 mg (1,000 mg Oral Given 08/28/23 1110)    ED Course/ Medical Decision Making/ A&P                                 Medical Decision Making Amount and/or Complexity of Data Reviewed Labs: ordered. Radiology: ordered.  Risk OTC drugs. Prescription drug management.   Patient with intermittent fever chills.  Worse the past 3 to 4 days.  Negative workup so far by primary care doctor.  Patient has not had any CT imaging related to this current illness.  Patient is very concerned something is not quite right.  Patient does have headache with this as well on and off.  No neck stiffness.  Nothing that appears to be consistent with meningitis.  Patient's temp here 100.1 pulse 87 blood pressure 160/79 respirations 18 oxygen saturations on room air is 99%.  Based on this will throw a broad net to see if we can determine the as possible source.  Will get labs urinalysis instead of getting just chest x-ray will go ahead and CT head chest abdomen and pelvis.  Patient is a heavy smoker.  Raising some concerns for pulmonary disease.  Also with the fever and chills there could be an occult infection somewhere.  To include liver abscess.  Lactic acid reassuring at 1.1.  Lipase 27 CBC white count 9.5 hemoglobin 13.2 platelets 214.  Complete metabolic panel normal LFTs and renal function.  Blood sugar elevated at 288 but CO2 is normal at 26.  Urinalysis little hemoconcentrated treated.  But no  signs of urinary tract infection.  Blood cultures are pending.  CT head no evidence of any acute abnormalities.  CT abdomen pelvis and chest somewhat ill-defined heterogenous groundglass consolidation in the medial right upper lobe with a more dense solid-appearing component medially abutting the pleura overall dimension 4.4 x 4 cm most solid medial component is 2.7 x 2.0 cm this is of uncertain nature possibly infectious or inflammatory possibly including a pulmonary abscess in the stated setting of intermittent fever and chills underlying solid mass not excluded.  CT and hand CT may be helpful for further assessment not on CT evidence of lymphadenopathy or metastatic disease in the chest abdomen and pelvis.  So right upper lobe with questionable area of infection or abscess.  Otherwise CT negative.  Based on this we will start antibiotics.  And contact hospitalist for admission.   Final Clinical Impression(s) / ED Diagnoses Final diagnoses:  Fever, unspecified fever cause  Pneumonia of both upper lobes due to infectious organism    Rx / DC Orders ED Discharge Orders     None         Nicklas Barns, MD 08/28/23 0930    Nicklas Barns, MD 08/28/23 1244

## 2023-08-28 NOTE — Hospital Course (Signed)
 68 year old black male DM TY 2 HTN HTN HLD previous skin infection Lower back pain, left toe amputation gallbladder removed Previous perianal fistula  Has had over a month of abdominal pain chills and been seen by PCP but worked up but no findings-follows with [facility] primary care without any significant lab findings Came to emergency room 6/6 for workup BUN/creatinine 11/1.0 LFTs normal lactic acid 1.1 WBC 9.5 platelet 214 hemoglobin 13 UA negative Blood cultures obtained CT head no evidence of acute abnormality CT abdomens chest pelvis showed ill-defined groundglass consolidation medial right upper lobe 4.4X 4 cm?  Inflammatory infectious No CT evidence of lymphadenopathy or metastatic disease in chest abdomen/pelvis  Started on ceftriaxone azithro--asked ED to change ot Unasyn, start IVF 75 cc/h  May need to get Pulm to see for bronch

## 2023-08-28 NOTE — H&P (Signed)
 History and Physical    [name] ONG:[id number] DOB: 10/05/1955 DOA: 08/28/2023  PCP: Pcp, No Patient coming from: home   I have personally briefly reviewed patient's old medical records in [facility]  Chief Complaint:  headache, stomach pain, fever  HPI: [name] is a 68 y.o. male with medical history significant of HTN, NIDDM2 present to ED due to above complaints,  also has intermittent headache with fever which get better with prn tylenol  . He does has intermittent dry cough, denies chest pain, no sob. No n/v, he is feeling weak. He denies dental issues.   ED Course:   Data reviewed: Blood pressure (!) 169/96, pulse 95, temperature 98.7 F (37.1 C), temperature source Oral, resp. rate (!) 30, height (P) 6\' 2"  (1.88 m), weight (P) 98.7 kg, SpO2 94%.  CT showed " ill-defined heterogeneous and ground-glass consolidation in the medial right upper lobe with a more dense, solid appearing component medially abutting the pleura, overall dimensions 4.4 x 4.0 cm, most solid medial component 2.7 x 2.0 cm. This is of uncertain nature, possibly infectious or inflammatory and possibly including a pulmonary abscess in the stated setting of intermittent fever and chills. Underlying solid mass not excluded."  Medications  iohexol (OMNIPAQUE) 300 MG/ML solution 100 mL (has no administration in time range)  0.9 %  sodium chloride  infusion (has no administration in time range)  acetaminophen  (TYLENOL ) tablet 1,000 mg (1,000 mg Oral Given 08/28/23 1110)  cefTRIAXone (ROCEPHIN) 1 g in sodium chloride  0.9 % 100 mL IVPB (0 g Intravenous Stopped 08/28/23 1341)  azithromycin  (ZITHROMAX ) 500 mg in sodium chloride  0.9 % 250 mL IVPB (0 mg Intravenous Stopped 08/28/23 1449)  Ampicillin-Sulbactam (UNASYN) 3 g in sodium chloride  0.9 % 100 mL IVPB (0 g Intravenous Stopped 08/28/23 1559)      Review of Systems: As per HPI otherwise all other systems reviewed and are negative.   Past Medical  History:  Diagnosis Date   Arthritis    Blood transfusion without reported diagnosis    30 yrs ago   Chronic low back pain    Diabetes (HCC)    Hypertension    Insomnia    Muscle cramps    Skin fissure    Uncontrolled type 2 diabetes mellitus without complication, without long-term current use of insulin 01/24/2015    Past Surgical History:  Procedure Laterality Date   CHOLECYSTECTOMY     EYE MUSCLE SURGERY     bilateral   FINGER SURGERY     right hand   TOE AMPUTATION     left    Social History  reports that he has been smoking cigarettes. He has a 30 pack-year smoking history. He has never used smokeless tobacco. He reports current alcohol use. He reports that he does not use drugs.  No Known Allergies  Family History  Problem Relation Age of Onset   Seizures Father 81       broken hip   Diabetes Sister    Hypertension Sister    Diabetes Sister    Diabetes Sister    Colon cancer Neg Hx     Prior to Admission medications   Medication Sig Start Date End Date Taking? Authorizing Provider  hydrochlorothiazide  (HYDRODIURIL ) 25 MG tablet Take 1 tablet by mouth daily. 07/08/23  Yes [provider]  hydrOXYzine (ATARAX) 25 MG tablet Take 25 mg by mouth at bedtime as needed. 08/04/23  Yes [provider]  pantoprazole (PROTONIX) 20 MG tablet Take 20 mg  by mouth daily. 06/08/23  Yes [provider]  ACCU-CHEK GUIDE TEST test strip SMARTSIG:Strip(s)    [provider]  atorvastatin  (LIPITOR) 10 MG tablet Take 1 tablet (10 mg total) by mouth daily. 12/18/18   Saguier, Gaylin Ke, PA-C  cephALEXin  (KEFLEX ) 500 MG capsule Take 1 capsule (500 mg total) by mouth 4 (four) times daily. 07/27/21   Joldersma, Logan, PA-C  CINNAMON PO Take 2 capsules by mouth daily.    [provider]  cyclobenzaprine  (FLEXERIL ) 10 MG tablet 1 tab po prior to sleep 06/15/19   Saguier, Gaylin Ke, PA-C  GARLIC PO Take 2 capsules by mouth daily.    [provider]   glyBURIDE  (DIABETA ) 5 MG tablet Take 1 tablet (5 mg total) by mouth daily with breakfast. 12/31/18   Saguier, Gaylin Ke, PA-C  HYDROcodone -acetaminophen  (NORCO) 5-325 MG tablet Take 1 tablet by mouth every 6 (six) hours as needed for moderate pain. 06/15/19   Saguier, Gaylin Ke, PA-C  lisinopril  (ZESTRIL ) 40 MG tablet Take 40 mg by mouth daily.    [provider]  lisinopril -hydrochlorothiazide  (ZESTORETIC ) 10-12.5 MG tablet Take 1 tablet by mouth daily. 01/11/19   Saguier, Gaylin Ke, PA-C  meloxicam  (MOBIC ) 7.5 MG tablet Take 1 tablet (7.5 mg total) by mouth daily. 12/31/18   Saguier, Gaylin Ke, PA-C  meloxicam  (MOBIC ) 7.5 MG tablet Take 1 tablet (7.5 mg total) by mouth daily. 06/15/19   Saguier, Gaylin Ke, PA-C  metFORMIN  (GLUCOPHAGE ) 1000 MG tablet Take 1 tablet (1,000 mg total) by mouth 2 (two) times daily with a meal. 12/31/18   Saguier, Gaylin Ke, PA-C  MICROLET LANCETS MISC Pt uses microlet lancets for his microlet next lancet device. 06/18/15   Henson, Vickie L, NP-C  sitaGLIPtin  (JANUVIA ) 50 MG tablet Take 1 tablet (50 mg total) by mouth daily. 06/15/19   Saguier, Gaylin Ke, PA-C    Physical Exam: Vitals:   08/28/23 1200 08/28/23 1258 08/28/23 1300 08/28/23 1500  BP: (!) 153/80   (!) 169/96  Pulse: 85   95  Resp: 18   (!) 30  Temp:  99.5 F (37.5 C) 98.7 F (37.1 C)   TempSrc:  Oral Oral   SpO2: 92%   94%  Weight:    (P) 98.7 kg  Height:    (P) 6\' 2"  (1.88 m)    Constitutional: NAD, calm, comfortable Eyes: PERRL, lids and conjunctivae normal ENMT: Mucous membranes are moist.  Respiratory: clear to auscultation bilaterally, no wheezing, no crackles. Normal respiratory effort. No accessory muscle use.  Cardiovascular: Regular rate and rhythm,  No extremity edema. 2+ pedal pulses. No carotid bruits.  Abdomen: no tenderness, not distended, Bowel sounds positive.  Musculoskeletal: no clubbing / cyanosis. No joint deformity upper and lower extremities. Good ROM, no contractures. Normal muscle tone.   Skin: no rashes, lesions, ulcers. No induration Neurologic: CN 2-12 grossly intact. Sensation intact, Strength 5/5 in all 4.  Psychiatric: Normal judgment and insight. Alert and oriented x 3. Normal mood.    Labs on Admission: I have personally reviewed following labs and imaging studies  CBC: Recent Labs  Lab 08/28/23 0933  WBC 9.5  NEUTROABS 6.8  HGB 13.2  HCT 39.4  MCV 90.6  PLT 214    Basic Metabolic Panel: Recent Labs  Lab 08/28/23 0933  NA 137  K 4.7  CL 96*  CO2 26  GLUCOSE 288*  BUN 11  CREATININE 1.07  CALCIUM  10.5*    GFR: CrCl cannot be calculated (Unknown ideal weight.).  Liver Function Tests: Recent Labs  Lab 08/28/23 0933  AST 15  ALT 17  ALKPHOS 58  BILITOT 0.5  PROT 7.6  ALBUMIN 4.1    Urine analysis:    Component Value Date/Time   COLORURINE YELLOW 08/28/2023 0933   APPEARANCEUR CLEAR 08/28/2023 0933   LABSPEC 1.033 (H) 08/28/2023 0933   PHURINE 6.0 08/28/2023 0933   GLUCOSEU >1,000 (A) 08/28/2023 0933   HGBUR NEGATIVE 08/28/2023 0933   BILIRUBINUR NEGATIVE 08/28/2023 0933   BILIRUBINUR n 02/04/2016 1152   KETONESUR 40 (A) 08/28/2023 0933   PROTEINUR NEGATIVE 08/28/2023 0933   UROBILINOGEN negative 02/04/2016 1152   NITRITE NEGATIVE 08/28/2023 0933   LEUKOCYTESUR NEGATIVE 08/28/2023 0933    Radiological Exams on Admission: CT Head Wo Contrast Result Date: 08/28/2023 CLINICAL DATA:  Headache, new onset. EXAM: CT HEAD WITHOUT CONTRAST TECHNIQUE: Contiguous axial images were obtained from the base of the skull through the vertex without intravenous contrast. RADIATION DOSE REDUCTION: This exam was performed according to the departmental dose-optimization program which includes automated exposure control, adjustment of the mA and/or kV according to patient size and/or use of iterative reconstruction technique. COMPARISON:  None Available. FINDINGS: Brain: No acute intracranial hemorrhage. No CT evidence of acute infarct. Nonspecific  hypoattenuation in the periventricular and subcortical white matter favored to reflect chronic microvascular ischemic changes. No edema, mass effect, or midline shift. The basilar cisterns are patent. Ventricles: The ventricles are normal. Vascular: Atherosclerotic calcifications of the carotid siphons. No hyperdense vessel. Skull: No acute or aggressive finding. Orbits: Orbits are symmetric. Sinuses: Mild mucosal thickening in the ethmoid sinuses. Other: Mastoid air cells are clear. IMPRESSION: No CT evidence of acute intracranial abnormality. Electronically Signed   By: Denny Flack M.D.   On: 08/28/2023 11:35   CT CHEST ABDOMEN PELVIS WO CONTRAST Result Date: 08/28/2023 CLINICAL DATA:  Sepsis, fever, chills * Tracking Code: BO * EXAM: CT CHEST, ABDOMEN AND PELVIS WITHOUT CONTRAST TECHNIQUE: Multidetector CT imaging of the chest, abdomen and pelvis was performed following the standard protocol without IV contrast. RADIATION DOSE REDUCTION: This exam was performed according to the departmental dose-optimization program which includes automated exposure control, adjustment of the mA and/or kV according to patient size and/or use of iterative reconstruction technique. COMPARISON:  None Available. FINDINGS: CT CHEST FINDINGS Cardiovascular: No significant vascular findings. Normal heart size. Three-vessel coronary artery calcifications. Enlargement of the main pulmonary artery measuring up to 3.8 cm in caliber. And no pericardial effusion. Mediastinum/Nodes: No enlarged mediastinal, hilar, or axillary lymph nodes. Thyroid gland, trachea, and esophagus demonstrate no significant findings. Lungs/Pleura: Somewhat ill-defined heterogeneous and ground-glass consolidation in the medial right upper lobe with a more dense, solid appearing component medially abutting the pleura, overall dimensions 4.4 x 4.0 cm (series 4, image 42), most solid medial component 2.7 x 2.0 cm (series 3, image 17). No pleural effusion or  pneumothorax. Musculoskeletal: No chest wall abnormality. No acute osseous findings. CT ABDOMEN PELVIS FINDINGS Hepatobiliary: No solid liver abnormality is seen. Hepatic steatosis. Status post cholecystectomy. No biliary dilatation. Possible dropped gallstones in the hepatorenal recess (series 3, image 67). Pancreas: Unremarkable. No pancreatic ductal dilatation or surrounding inflammatory changes. Spleen: Normal in size without significant abnormality. Adrenals/Urinary Tract: Adrenal glands are unremarkable. Kidneys are normal, without renal calculi, solid lesion, or hydronephrosis. Bladder is unremarkable. Stomach/Bowel: Stomach is within normal limits. Appendix appears normal. No evidence of bowel wall thickening, distention, or inflammatory changes. Vascular/Lymphatic: Aortic atherosclerosis. No enlarged abdominal or pelvic lymph nodes. Reproductive: No mass or other abnormality. Other: No abdominal wall  hernia or abnormality. No ascites. Musculoskeletal: No acute osseous findings. IMPRESSION: 1. Somewhat ill-defined heterogeneous and ground-glass consolidation in the medial right upper lobe with a more dense, solid appearing component medially abutting the pleura, overall dimensions 4.4 x 4.0 cm, most solid medial component 2.7 x 2.0 cm. This is of uncertain nature, possibly infectious or inflammatory and possibly including a pulmonary abscess in the stated setting of intermittent fever and chills. Underlying solid mass not excluded. Contrast enhanced CT may be helpful for further assessment. 2. No noncontrast CT evidence of lymphadenopathy or metastatic disease in the chest, abdomen, or pelvis. 3. Enlargement of the main pulmonary artery, as can be seen in pulmonary hypertension. 4. Coronary artery disease. 5. Hepatic steatosis. 6. Status post cholecystectomy. Aortic Atherosclerosis (ICD10-I70.0). Electronically Signed   By: Fredricka Jenny M.D.   On: 08/28/2023 11:19    EKG: Independently reviewed.    Assessment/Plan Principal Problem:   Lung abscess without pneumonia (HCC)   pneumonia Lung abscess? Does has fever, no leukocytosis  Continue unasyn, f/u blood culture  Pulm consulted ,Dr Baldwin Levee will see patient on 6/7  NIDDM2 Start ssi  HTN, continue home meds lisinopril  with holding parameters   DVT prophylaxis: lovenox ( please hold lovenox if bronchoscopy planned )   Code Status:   Full code      Consults called:  Pulmonology  Admission status:  Inpatient      Voice Recognition /Dragon dictation system was used to create this note, attempts have been made to correct errors. Please contact the author with questions and/or clarifications.  Lavaughn Portland MD PhD FACP [facility]  How to contact the [facility] Attending or Consulting provider 7A - 7P or covering provider during after hours 7P -7A, for this patient?   Check the care team in [facility] and look for a) attending/consulting [facility] provider listed and b) the [facility] team listed Log into [email or web address] and use [place]'s universal password to access. If you do not have the password, please contact the hospital operator. Locate the [facility] provider you are looking for under [facility] and page to a number that you can be directly reached. If you still have difficulty reaching the provider, please page the [facility] (Director on Call) for the Hospitalists listed on amion for assistance.  08/28/2023, 6:11 PM    For on call review [email or web address].

## 2023-08-29 DIAGNOSIS — R509 Fever, unspecified: Secondary | ICD-10-CM | POA: Diagnosis present

## 2023-08-29 LAB — GLUCOSE, CAPILLARY
Glucose-Capillary: 259 mg/dL — ABNORMAL HIGH (ref 70–99)
Glucose-Capillary: 273 mg/dL — ABNORMAL HIGH (ref 70–99)
Glucose-Capillary: 324 mg/dL — ABNORMAL HIGH (ref 70–99)
Glucose-Capillary: 374 mg/dL — ABNORMAL HIGH (ref 70–99)

## 2023-08-29 LAB — HIV ANTIBODY (ROUTINE TESTING W REFLEX): HIV Screen 4th Generation wRfx: NONREACTIVE

## 2023-08-29 MED ORDER — ENSURE PLUS HIGH PROTEIN PO LIQD
237.0000 mL | Freq: Three times a day (TID) | ORAL | Status: DC
  Administered 2023-08-29 – 2023-08-31 (×2): 237 mL via ORAL

## 2023-08-29 MED ORDER — LISINOPRIL 20 MG PO TABS
20.0000 mg | ORAL_TABLET | Freq: Every day | ORAL | Status: DC
  Administered 2023-08-29 – 2023-09-01 (×4): 20 mg via ORAL
  Filled 2023-08-29 (×4): qty 1

## 2023-08-29 MED ORDER — IBUPROFEN 800 MG PO TABS
800.0000 mg | ORAL_TABLET | Freq: Once | ORAL | Status: AC
  Administered 2023-08-29: 800 mg via ORAL
  Filled 2023-08-29: qty 1

## 2023-08-29 MED ORDER — LINAGLIPTIN 5 MG PO TABS
5.0000 mg | ORAL_TABLET | Freq: Every day | ORAL | Status: DC
  Administered 2023-08-29 – 2023-09-02 (×5): 5 mg via ORAL
  Filled 2023-08-29 (×5): qty 1

## 2023-08-29 MED ORDER — ACETAMINOPHEN 500 MG PO TABS
1000.0000 mg | ORAL_TABLET | Freq: Four times a day (QID) | ORAL | Status: DC | PRN
  Administered 2023-08-29 – 2023-09-02 (×11): 1000 mg via ORAL
  Filled 2023-08-29 (×11): qty 2

## 2023-08-29 MED ORDER — ENOXAPARIN SODIUM 40 MG/0.4ML IJ SOSY
40.0000 mg | PREFILLED_SYRINGE | INTRAMUSCULAR | Status: AC
  Filled 2023-08-29: qty 0.4

## 2023-08-29 NOTE — H&P (View-Only) (Signed)
 NAME:  [name], MRN:  [id number], DOB:  1955/10/22, LOS: 1 ADMISSION DATE:  08/28/2023, CONSULTATION DATE: 08/29/2023 REFERRING MD:  Dr Christiane Cowing, CHIEF COMPLAINT: Abnormal CT chest  History of Present Illness:  68 year old male with history of tobacco use (30 pack years), diabetes, hypertension, chronic low back pain.  He was seen in the emergency department 6/6 for evaluation of fevers, chills on and off for about 1 month.  These were worse over the 3-4 days prior to admission, sometimes associated with headache, abdominal pain.  WBC normal.  Head CT was normal.  A CT scan of his chest, abdomen, pelvis was performed in the ED that showed an ill-defined heterogeneous groundglass rounded consolidation in the medial right upper lobe abutting the pleura 4.4 x 4.0 cm, infectious versus other.  No apparent mediastinal or abdominal adenopathy.  COVID-19, influenza, RSV were all negative.  He was started on ceftriaxone  and azithromycin , subsequently changed to Unasyn .  Remains on room air  Pertinent  Medical History   Past Medical History:  Diagnosis Date   Arthritis    Blood transfusion without reported diagnosis    30 yrs ago   Chronic low back pain    Diabetes (HCC)    Hypertension    Insomnia    Muscle cramps    Skin fissure    Uncontrolled type 2 diabetes mellitus without complication, without long-term current use of insulin  01/24/2015    Significant Hospital Events: Including procedures, antibiotic start and stop dates in addition to other pertinent events   CT chest, abdomen, pelvis 6/6 >> heterogeneous groundglass mixed density 4.4 x 4.0 cm right upper lobe rounded opacity with air bronchograms, uncertain etiology consider infectious versus possible adenocarcinoma.  No mediastinal or abdominal lymphadenopathy.  There are some areas of pleural-based thickening versus nodularity at the bilateral bases  Interim History / Subjective:  On room air Tmax 102.7 Has not had any cough, sputum  production   Objective    Blood pressure (!) 157/71, pulse 90, temperature (!) 101.1 F (38.4 C), temperature source Oral, resp. rate 20, height 6\' 2"  (1.88 m), weight 98.7 kg, SpO2 93%.        Intake/Output Summary (Last 24 hours) at 08/29/2023 0918 Last data filed at 08/29/2023 3474 Gross per 24 hour  Intake 692.48 ml  Output 300 ml  Net 392.48 ml   Filed Weights   08/28/23 1500  Weight: 98.7 kg    Examination: General: Well-built man in no distress HENT: Oropharynx clear, strong voice, no secretions Lungs: Clear bilaterally Cardiovascular: Regular, no murmur Abdomen: Soft, nondistended, nontender Extremities: No edema Neuro: Awake, alert, appropriate.  Follows commands, interacts.  No deficits GU: Deferred  Resolved problem list   Assessment and Plan  Right upper lobe mixed density opacity of unclear cause in a patient with history of tobacco use.  Some infectious prodrome with fever, chills but no localizing symptoms such as cough, sputum production.  Consider infection versus adenocarcinoma. - Agree with antibiotics as ordered, consider adding azithromycin  to cover atypicals - Recommended navigational bronchoscopy to more directly sample right upper lobe opacity, obtain culture data and cytology data.  He agrees.  I will get this scheduled for Monday, 08/31/2023 at [facility] endoscopy.  Will hold his enoxaparin  Sunday night, make him n.p.o. -If he is discharged home then this can be done as an outpatient.  Explained to him that he will need a designated driver and someone to watch him at home that day after the procedure. - Needs  smoking cessation   Labs   CBC: Recent Labs  Lab 08/28/23 0933  WBC 9.5  NEUTROABS 6.8  HGB 13.2  HCT 39.4  MCV 90.6  PLT 214    Basic Metabolic Panel: Recent Labs  Lab 08/28/23 0933  NA 137  K 4.7  CL 96*  CO2 26  GLUCOSE 288*  BUN 11  CREATININE 1.07  CALCIUM  10.5*   GFR: Estimated Creatinine Clearance: 86.4 mL/min (by C-G  formula based on SCr of 1.07 mg/dL). Recent Labs  Lab 08/28/23 0933 08/28/23 1132  WBC 9.5  --   LATICACIDVEN 1.1 1.1    Liver Function Tests: Recent Labs  Lab 08/28/23 0933  AST 15  ALT 17  ALKPHOS 58  BILITOT 0.5  PROT 7.6  ALBUMIN 4.1   Recent Labs  Lab 08/28/23 0933  LIPASE 27    HbA1C: Hgb A1c MFr Bld  Date/Time Value Ref Range Status  04/15/2019 09:00 AM 9.5 (H) 4.6 - 6.5 % Final    Comment:    Glycemic Control Guidelines for People with Diabetes:Non Diabetic:  <6%Goal of Therapy: <7%Additional Action Suggested:  >8%   12/17/2018 11:23 AM 11.7 (H) 4.6 - 6.5 % Final    Comment:    Glycemic Control Guidelines for People with Diabetes:Non Diabetic:  <6%Goal of Therapy: <7%Additional Action Suggested:  >8%     CBG: Recent Labs  Lab 08/28/23 2022 08/29/23 0738  GLUCAP 265* 273*    Review of Systems:   NA  Past Medical History:  He,  has a past medical history of Arthritis, Blood transfusion without reported diagnosis, Chronic low back pain, Diabetes (HCC), Hypertension, Insomnia, Muscle cramps, Skin fissure, and Uncontrolled type 2 diabetes mellitus without complication, without long-term current use of insulin  (01/24/2015).   Surgical History:   Past Surgical History:  Procedure Laterality Date   CHOLECYSTECTOMY     EYE MUSCLE SURGERY     bilateral   FINGER SURGERY     right hand   TOE AMPUTATION     left     Social History:   reports that he has been smoking cigarettes. He has a 30 pack-year smoking history. He has never used smokeless tobacco. He reports current alcohol use. He reports that he does not use drugs.   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  There is no history of Colon cancer.   Allergies Allergies  Allergen Reactions   Iodinated Contrast Media Itching and Other (See Comments)    Pruritis and a single hive following first time administration of IV contrast.  Benadryl administered with improvement of symptoms.     Home Medications  Prior to Admission medications   Medication Sig Start Date End Date Taking? Authorizing Provider  atorvastatin  (LIPITOR) 10 MG tablet Take 1 tablet (10 mg total) by mouth daily. 12/18/18  Yes Saguier, Gaylin Ke, PA-C  CINNAMON PO Take 3 capsules by mouth 2 (two) times daily.   Yes [provider]  gabapentin (NEURONTIN) 300 MG capsule Take 900 mg by mouth at bedtime.   Yes [provider]  GARLIC PO Take 2 capsules by mouth daily.   Yes [provider]  hydrochlorothiazide  (HYDRODIURIL ) 25 MG tablet Take 25 mg by mouth every other  day. 07/08/23  Yes [provider]  HYDROcodone -acetaminophen  (NORCO) 5-325 MG tablet Take 1 tablet by mouth every 6 (six) hours as needed for moderate pain. Patient taking differently: Take 2 tablets by mouth every 8 (eight) hours as needed (for pain). 06/15/19  Yes Saguier, Gaylin Ke, PA-C  hydrOXYzine (ATARAX) 25 MG tablet Take 25 mg by mouth at bedtime. 08/04/23  Yes [provider]  lisinopril  (ZESTRIL ) 40 MG tablet Take 40 mg by mouth daily.   Yes [provider]  metFORMIN  (GLUCOPHAGE ) 1000 MG tablet Take 1 tablet (1,000 mg total) by mouth 2 (two) times daily with a meal. 12/31/18  Yes Saguier, Gaylin Ke, PA-C  pantoprazole (PROTONIX) 20 MG tablet Take 20 mg by mouth at bedtime. 06/08/23  Yes [provider]  ACCU-CHEK GUIDE TEST test strip SMARTSIG:Strip(s)    [provider]  cephALEXin  (KEFLEX ) 500 MG capsule Take 1 capsule (500 mg total) by mouth 4 (four) times daily. Patient not taking: Reported on 08/28/2023 07/27/21   Joldersma, Logan, PA-C  cyclobenzaprine  (FLEXERIL ) 10 MG tablet 1 tab po prior to  sleep Patient not taking: Reported on 08/28/2023 06/15/19   Saguier, Gaylin Ke, PA-C  glyBURIDE  (DIABETA ) 5 MG tablet Take 1 tablet (5 mg total) by mouth daily with breakfast. Patient not taking: Reported on 08/28/2023 12/31/18   Saguier, Gaylin Ke, PA-C  lisinopril -hydrochlorothiazide  (ZESTORETIC ) 10-12.5 MG tablet Take 1 tablet by mouth daily. Patient not taking: Reported on 08/28/2023 01/11/19   Saguier, Gaylin Ke, PA-C  meloxicam  (MOBIC ) 7.5 MG tablet Take 1 tablet (7.5 mg total) by mouth daily. Patient not taking: Reported on 08/28/2023 12/31/18   Saguier, Gaylin Ke, PA-C  meloxicam  (MOBIC ) 7.5 MG tablet Take 1 tablet (7.5 mg total) by mouth daily. Patient not taking: Reported on 08/28/2023 06/15/19   Saguier, Gaylin Ke, PA-C  MICROLET LANCETS MISC Pt uses microlet lancets for his microlet next lancet device. 06/18/15   Henson, Vickie L, NP-C  sitaGLIPtin  (JANUVIA ) 50 MG tablet Take 1 tablet (50 mg total) by mouth daily. Patient not taking: Reported on 08/28/2023 06/15/19   Saguier, Edward, PA-C     Critical care time: NA      Racheal Buddle, MD, PhD 08/29/2023, 9:18 AM [facility] [phone] or if no answer before 7:00PM call [phone] For any issues after 7:00PM please call eLink [phone]

## 2023-08-29 NOTE — Consult Note (Addendum)
 NAME:  [name], MRN:  [id number], DOB:  1955/10/22, LOS: 1 ADMISSION DATE:  08/28/2023, CONSULTATION DATE: 08/29/2023 REFERRING MD:  Dr Christiane Cowing, CHIEF COMPLAINT: Abnormal CT chest  History of Present Illness:  68 year old male with history of tobacco use (30 pack years), diabetes, hypertension, chronic low back pain.  He was seen in the emergency department 6/6 for evaluation of fevers, chills on and off for about 1 month.  These were worse over the 3-4 days prior to admission, sometimes associated with headache, abdominal pain.  WBC normal.  Head CT was normal.  A CT scan of his chest, abdomen, pelvis was performed in the ED that showed an ill-defined heterogeneous groundglass rounded consolidation in the medial right upper lobe abutting the pleura 4.4 x 4.0 cm, infectious versus other.  No apparent mediastinal or abdominal adenopathy.  COVID-19, influenza, RSV were all negative.  He was started on ceftriaxone  and azithromycin , subsequently changed to Unasyn .  Remains on room air  Pertinent  Medical History   Past Medical History:  Diagnosis Date   Arthritis    Blood transfusion without reported diagnosis    30 yrs ago   Chronic low back pain    Diabetes (HCC)    Hypertension    Insomnia    Muscle cramps    Skin fissure    Uncontrolled type 2 diabetes mellitus without complication, without long-term current use of insulin  01/24/2015    Significant Hospital Events: Including procedures, antibiotic start and stop dates in addition to other pertinent events   CT chest, abdomen, pelvis 6/6 >> heterogeneous groundglass mixed density 4.4 x 4.0 cm right upper lobe rounded opacity with air bronchograms, uncertain etiology consider infectious versus possible adenocarcinoma.  No mediastinal or abdominal lymphadenopathy.  There are some areas of pleural-based thickening versus nodularity at the bilateral bases  Interim History / Subjective:  On room air Tmax 102.7 Has not had any cough, sputum  production   Objective    Blood pressure (!) 157/71, pulse 90, temperature (!) 101.1 F (38.4 C), temperature source Oral, resp. rate 20, height 6\' 2"  (1.88 m), weight 98.7 kg, SpO2 93%.        Intake/Output Summary (Last 24 hours) at 08/29/2023 0918 Last data filed at 08/29/2023 3474 Gross per 24 hour  Intake 692.48 ml  Output 300 ml  Net 392.48 ml   Filed Weights   08/28/23 1500  Weight: 98.7 kg    Examination: General: Well-built man in no distress HENT: Oropharynx clear, strong voice, no secretions Lungs: Clear bilaterally Cardiovascular: Regular, no murmur Abdomen: Soft, nondistended, nontender Extremities: No edema Neuro: Awake, alert, appropriate.  Follows commands, interacts.  No deficits GU: Deferred  Resolved problem list   Assessment and Plan  Right upper lobe mixed density opacity of unclear cause in a patient with history of tobacco use.  Some infectious prodrome with fever, chills but no localizing symptoms such as cough, sputum production.  Consider infection versus adenocarcinoma. - Agree with antibiotics as ordered, consider adding azithromycin  to cover atypicals - Recommended navigational bronchoscopy to more directly sample right upper lobe opacity, obtain culture data and cytology data.  He agrees.  I will get this scheduled for Monday, 08/31/2023 at [facility] endoscopy.  Will hold his enoxaparin  Sunday night, make him n.p.o. -If he is discharged home then this can be done as an outpatient.  Explained to him that he will need a designated driver and someone to watch him at home that day after the procedure. - Needs  smoking cessation   Labs   CBC: Recent Labs  Lab 08/28/23 0933  WBC 9.5  NEUTROABS 6.8  HGB 13.2  HCT 39.4  MCV 90.6  PLT 214    Basic Metabolic Panel: Recent Labs  Lab 08/28/23 0933  NA 137  K 4.7  CL 96*  CO2 26  GLUCOSE 288*  BUN 11  CREATININE 1.07  CALCIUM  10.5*   GFR: Estimated Creatinine Clearance: 86.4 mL/min (by C-G  formula based on SCr of 1.07 mg/dL). Recent Labs  Lab 08/28/23 0933 08/28/23 1132  WBC 9.5  --   LATICACIDVEN 1.1 1.1    Liver Function Tests: Recent Labs  Lab 08/28/23 0933  AST 15  ALT 17  ALKPHOS 58  BILITOT 0.5  PROT 7.6  ALBUMIN 4.1   Recent Labs  Lab 08/28/23 0933  LIPASE 27    HbA1C: Hgb A1c MFr Bld  Date/Time Value Ref Range Status  04/15/2019 09:00 AM 9.5 (H) 4.6 - 6.5 % Final    Comment:    Glycemic Control Guidelines for People with Diabetes:Non Diabetic:  <6%Goal of Therapy: <7%Additional Action Suggested:  >8%   12/17/2018 11:23 AM 11.7 (H) 4.6 - 6.5 % Final    Comment:    Glycemic Control Guidelines for People with Diabetes:Non Diabetic:  <6%Goal of Therapy: <7%Additional Action Suggested:  >8%     CBG: Recent Labs  Lab 08/28/23 2022 08/29/23 0738  GLUCAP 265* 273*    Review of Systems:   NA  Past Medical History:  He,  has a past medical history of Arthritis, Blood transfusion without reported diagnosis, Chronic low back pain, Diabetes (HCC), Hypertension, Insomnia, Muscle cramps, Skin fissure, and Uncontrolled type 2 diabetes mellitus without complication, without long-term current use of insulin  (01/24/2015).   Surgical History:   Past Surgical History:  Procedure Laterality Date   CHOLECYSTECTOMY     EYE MUSCLE SURGERY     bilateral   FINGER SURGERY     right hand   TOE AMPUTATION     left     Social History:   reports that he has been smoking cigarettes. He has a 30 pack-year smoking history. He has never used smokeless tobacco. He reports current alcohol use. He reports that he does not use drugs.   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  There is no history of Colon cancer.   Allergies Allergies  Allergen Reactions   Iodinated Contrast Media Itching and Other (See Comments)    Pruritis and a single hive following first time administration of IV contrast.  Benadryl administered with improvement of symptoms.     Home Medications  Prior to Admission medications   Medication Sig Start Date End Date Taking? Authorizing Provider  atorvastatin  (LIPITOR) 10 MG tablet Take 1 tablet (10 mg total) by mouth daily. 12/18/18  Yes Saguier, Gaylin Ke, PA-C  CINNAMON PO Take 3 capsules by mouth 2 (two) times daily.   Yes [provider]  gabapentin (NEURONTIN) 300 MG capsule Take 900 mg by mouth at bedtime.   Yes [provider]  GARLIC PO Take 2 capsules by mouth daily.   Yes [provider]  hydrochlorothiazide  (HYDRODIURIL ) 25 MG tablet Take 25 mg by mouth every other  day. 07/08/23  Yes [provider]  HYDROcodone -acetaminophen  (NORCO) 5-325 MG tablet Take 1 tablet by mouth every 6 (six) hours as needed for moderate pain. Patient taking differently: Take 2 tablets by mouth every 8 (eight) hours as needed (for pain). 06/15/19  Yes Saguier, Gaylin Ke, PA-C  hydrOXYzine (ATARAX) 25 MG tablet Take 25 mg by mouth at bedtime. 08/04/23  Yes [provider]  lisinopril  (ZESTRIL ) 40 MG tablet Take 40 mg by mouth daily.   Yes [provider]  metFORMIN  (GLUCOPHAGE ) 1000 MG tablet Take 1 tablet (1,000 mg total) by mouth 2 (two) times daily with a meal. 12/31/18  Yes Saguier, Gaylin Ke, PA-C  pantoprazole (PROTONIX) 20 MG tablet Take 20 mg by mouth at bedtime. 06/08/23  Yes [provider]  ACCU-CHEK GUIDE TEST test strip SMARTSIG:Strip(s)    [provider]  cephALEXin  (KEFLEX ) 500 MG capsule Take 1 capsule (500 mg total) by mouth 4 (four) times daily. Patient not taking: Reported on 08/28/2023 07/27/21   Joldersma, Logan, PA-C  cyclobenzaprine  (FLEXERIL ) 10 MG tablet 1 tab po prior to  sleep Patient not taking: Reported on 08/28/2023 06/15/19   Saguier, Gaylin Ke, PA-C  glyBURIDE  (DIABETA ) 5 MG tablet Take 1 tablet (5 mg total) by mouth daily with breakfast. Patient not taking: Reported on 08/28/2023 12/31/18   Saguier, Gaylin Ke, PA-C  lisinopril -hydrochlorothiazide  (ZESTORETIC ) 10-12.5 MG tablet Take 1 tablet by mouth daily. Patient not taking: Reported on 08/28/2023 01/11/19   Saguier, Gaylin Ke, PA-C  meloxicam  (MOBIC ) 7.5 MG tablet Take 1 tablet (7.5 mg total) by mouth daily. Patient not taking: Reported on 08/28/2023 12/31/18   Saguier, Gaylin Ke, PA-C  meloxicam  (MOBIC ) 7.5 MG tablet Take 1 tablet (7.5 mg total) by mouth daily. Patient not taking: Reported on 08/28/2023 06/15/19   Saguier, Gaylin Ke, PA-C  MICROLET LANCETS MISC Pt uses microlet lancets for his microlet next lancet device. 06/18/15   Henson, Vickie L, NP-C  sitaGLIPtin  (JANUVIA ) 50 MG tablet Take 1 tablet (50 mg total) by mouth daily. Patient not taking: Reported on 08/28/2023 06/15/19   Saguier, Edward, PA-C     Critical care time: NA      Racheal Buddle, MD, PhD 08/29/2023, 9:18 AM [facility] [phone] or if no answer before 7:00PM call [phone] For any issues after 7:00PM please call eLink [phone]

## 2023-08-29 NOTE — Progress Notes (Incomplete)
 [facility] Progress Note  Patient: [name]     VHQ:[id number]  DOA: 08/28/2023   PCP: [facility]       Brief hospital course: This is a 68 year old male with diabetes mellitus and hypertension who presented to the hospital with fever.  The patient states that his problem started when he developed fevers in the evenings along with night sweats a little over a month ago.  He would take Tylenol  for breakout into sweats and have resolution of the fever.  His symptoms progressed and he lost his appetite and became steadily weaker.  He saw his primary care and had blood work done which did not show any etiology.  His symptoms persisted and he eventually decided to come to the ED on 6/5. In the ED: Temp of 100.1 which rose to 101.7. WBC count normal at 9.5. CT scan of the chest revealed 4 x 4 centimeter right upper lobe consolidation.  Initially started on ceftriaxone  and azithromycin  and then Unasyn .  Subjective:  Feels very weak.  Force himself to eat today and was able to eat at least half his meals.  Admits to dry cough for the past couple of weeks.  No nausea vomiting or diarrhea.  Assessment and Plan: Principal Problem:   Fever - Respiratory panel, influenza, RSV and COVID negative - UA negative - Fever was still 102.7 after receiving a dose of Rocephin , azithromycin  and Unasyn  - Right upper lobe consolidation may be an infection-bronc planned for Monday - Started on Unasyn  - Follow-up blood cultures   Active Problems:  Right upper lobe consolidation -- Right upper lobe consolidation on CT scan could be infectious versus cancerous -- Has been evaluated by pulmonary and bronchoscopy planned for Monday  Loss of appetite with generalized weakness - Secondary to the fevers, suspected underlying lung infection versus adenocarcinoma - Will add Ensure 3 times daily   Diabetes mellitus type 2 - Takes metformin , glyburide  and Januvia  - Have resumed Januvia  which  is replaced by Tradjenta by our pharmacy - Continue sliding scale insulin  - Obtain A1c  Hypertension --Takes lisinopril  HCTZ 10/12.5 daily and HCTZ 25 mg every other day - Resume lisinopril  and continue to hold HCTZ while appetite is poor     Code Status: Full Code Total time on patient care: *** DVT prophylaxis:  ***   Objective:   Vitals:   08/29/23 0544 08/29/23 0639 08/29/23 1350 08/29/23 1352  BP: (!) 157/71  (!) 151/75 (!) 141/76  Pulse: 90  72 69  Resp: 20  16 16   Temp: (!) 102.7 F (39.3 C) (!) 101.1 F (38.4 C) 98.9 F (37.2 C) 98.6 F (37 C)  TempSrc:  Oral  Oral  SpO2: 93%  99% 96%  Weight:      Height:       Filed Weights   08/28/23 1500  Weight: 98.7 kg   Exam: ***    CBC: Recent Labs  Lab 08/28/23 0933  WBC 9.5  NEUTROABS 6.8  HGB 13.2  HCT 39.4  MCV 90.6  PLT 214   Basic Metabolic Panel: Recent Labs  Lab 08/28/23 0933  NA 137  K 4.7  CL 96*  CO2 26  GLUCOSE 288*  BUN 11  CREATININE 1.07  CALCIUM  10.5*     Scheduled Meds:  atorvastatin   10 mg Oral Daily   enoxaparin  (LOVENOX ) injection  40 mg Subcutaneous Q24H   insulin  aspart  0-15 Units Subcutaneous TID AC & HS    Imaging and  lab data personally reviewed   Author: Sedalia Dacosta  08/29/2023 3:31 PM  To contact [facility]>   Check the care team in [facility] and look for the attending/consulting [facility] provider listed  Log into [email or web address] and use [place]'s universal password   Go to> "[facility]"  and find provider  If you still have difficulty reaching the provider, please page the [facility] (Director on Call) for the Hospitalists listed on amion

## 2023-08-29 NOTE — Plan of Care (Signed)
  Problem: Education: Goal: Knowledge of General Education information will improve Description: Including pain rating scale, medication(s)/side effects and non-pharmacologic comfort measures Outcome: Progressing   Problem: Health Behavior/Discharge Planning: Goal: Ability to manage health-related needs will improve Outcome: Progressing   Problem: Clinical Measurements: Goal: Diagnostic test results will improve Outcome: Progressing   Problem: Nutritional: Goal: Maintenance of adequate nutrition will improve Outcome: Progressing

## 2023-08-30 DIAGNOSIS — R509 Fever, unspecified: Secondary | ICD-10-CM | POA: Diagnosis not present

## 2023-08-30 LAB — CBC
HCT: 38.8 % — ABNORMAL LOW (ref 39.0–52.0)
Hemoglobin: 12.1 g/dL — ABNORMAL LOW (ref 13.0–17.0)
MCH: 30 pg (ref 26.0–34.0)
MCHC: 31.2 g/dL (ref 30.0–36.0)
MCV: 96 fL (ref 80.0–100.0)
Platelets: 219 10*3/uL (ref 150–400)
RBC: 4.04 MIL/uL — ABNORMAL LOW (ref 4.22–5.81)
RDW: 13.5 % (ref 11.5–15.5)
WBC: 8.4 10*3/uL (ref 4.0–10.5)
nRBC: 0 % (ref 0.0–0.2)

## 2023-08-30 LAB — GLUCOSE, CAPILLARY
Glucose-Capillary: 274 mg/dL — ABNORMAL HIGH (ref 70–99)
Glucose-Capillary: 293 mg/dL — ABNORMAL HIGH (ref 70–99)
Glucose-Capillary: 329 mg/dL — ABNORMAL HIGH (ref 70–99)
Glucose-Capillary: 272 mg/dL — ABNORMAL HIGH (ref 70–99)

## 2023-08-30 LAB — BASIC METABOLIC PANEL WITH GFR
Anion gap: 12 (ref 5–15)
BUN: 10 mg/dL (ref 8–23)
CO2: 24 mmol/L (ref 22–32)
Calcium: 8.5 mg/dL — ABNORMAL LOW (ref 8.9–10.3)
Chloride: 104 mmol/L (ref 98–111)
Creatinine, Ser: 1.05 mg/dL (ref 0.61–1.24)
GFR, Estimated: >60 mL/min (ref >60)
Glucose, Bld: 219 mg/dL — ABNORMAL HIGH (ref 70–99)
Potassium: 4 mmol/L (ref 3.5–5.1)
Sodium: 140 mmol/L (ref 135–145)

## 2023-08-30 MED ORDER — INSULIN GLARGINE-YFGN 100 UNIT/ML ~~LOC~~ SOLN
10.0000 [IU] | SUBCUTANEOUS | Status: DC
  Administered 2023-08-30 – 2023-08-31 (×2): 10 [IU] via SUBCUTANEOUS
  Filled 2023-08-30 (×2): qty 0.1

## 2023-08-30 NOTE — Plan of Care (Signed)

## 2023-08-30 NOTE — Progress Notes (Signed)
 [facility] Progress Note  Patient: [name]     UJW:[id number]  DOA: 08/28/2023   PCP: Health, [facility]       Brief hospital course: This is a 68 year old male with diabetes mellitus and hypertension who presented to the hospital with fever.  The patient states that his problem started when he developed fevers in the evenings along with night sweats a little over a month ago.  He would take Tylenol  for breakout into sweats and have resolution of the fever.  His symptoms progressed and he lost his appetite and became steadily weaker.  He saw his primary care and had blood work done which did not show any etiology.  His symptoms persisted and he eventually decided to come to the ED on 6/5. In the ED: Temp of 100.1 which rose to 101.7. WBC count normal at 9.5. CT scan of the chest revealed 4 x 4 centimeter right upper lobe consolidation.  Initially started on ceftriaxone  and azithromycin  and then Unasyn .  Subjective:  Continues to have poor oral intake.  No other complaints  Assessment and Plan: Principal Problem:   Fever, ongoingRight upper lobe consolidation Fever curve: 6/7 @ 5AM>  102.7, @ 2000 102.2 6/8 5AM 100 degrees - Rocephin , azithromycin  given 1 x in ED and then started on Unasyn  - Right upper lobe consolidation may be an infection-bronc planned for Monday - Respiratory panel, influenza, RSV and COVID negative - UA negative - Follow-up blood cultures- NGTD so far   Active Problems:  Loss of appetite with generalized weakness - Secondary to the fevers, suspected underlying lung infection versus adenocarcinoma - Will add Ensure 3 times daily and get nutrition eval - on NS at 75 cc/hr for maintenece   Diabetes mellitus type 2 - Takes metformin , glyburide  and Januvia  - Have resumed Januvia  which is replaced by Tradjenta by our pharmacy - Continue sliding scale insulin  - A1c still pending - have added semglee  Hypertension --Takes lisinopril  HCTZ  10/12.5 daily and HCTZ 25 mg every other day - Resume lisinopril  and hold hydrochlorothiazide  as he is dehydrated (receiving IVF)and appetite is poor     Code Status: Full Code Total time on patient care: 35 DVT prophylaxis:  enoxaparin  (LOVENOX ) injection 40 mg Start: 08/29/23 2200 SCDs Start: 08/28/23 1851     Objective:   Vitals:   08/29/23 2130 08/30/23 0147 08/30/23 0534 08/30/23 0937  BP: (!) 148/68 127/71 136/64 (!) 138/106  Pulse: 84 72 78 78  Resp: 20 20 18  (!) 24  Temp: 99.9 F (37.7 C) 99.8 F (37.7 C) 100 F (37.8 C) 99 F (37.2 C)  TempSrc: Oral  Oral   SpO2: 98% 99% 97% 96%  Weight:      Height:       Filed Weights   08/28/23 1500  Weight: 98.7 kg   Exam: General exam: Appears comfortable  HEENT: oral mucosa moist Respiratory system: Clear to auscultation.  Cardiovascular system: S1 & S2 heard  Gastrointestinal system: Abdomen soft, non-tender, nondistended. Normal bowel sounds   Extremities: No cyanosis, clubbing or edema Psychiatry:  Mood & affect appropriate.    CBC: Recent Labs  Lab 08/28/23 0933 08/30/23 0531  WBC 9.5 8.4  NEUTROABS 6.8  --   HGB 13.2 12.1*  HCT 39.4 38.8*  MCV 90.6 96.0  PLT 214 219   Basic Metabolic Panel: Recent Labs  Lab 08/28/23 0933 08/30/23 0531  NA 137 140  K 4.7 4.0  CL 96* 104  CO2 26 24  GLUCOSE 288* 219*  BUN 11 10  CREATININE 1.07 1.05  CALCIUM  10.5* 8.5*     Scheduled Meds:  atorvastatin   10 mg Oral Daily   enoxaparin  (LOVENOX ) injection  40 mg Subcutaneous Q24H   feeding supplement  237 mL Oral TID BM   insulin  aspart  0-15 Units Subcutaneous TID AC & HS   insulin  glargine-yfgn  10 Units Subcutaneous Q24H   linagliptin  5 mg Oral Daily   lisinopril   20 mg Oral Daily    Imaging and lab data personally reviewed   Author: Mylisa Brunson  08/30/2023 9:41 AM  To contact [facility]>   Check the care team in [facility] and look for the attending/consulting [facility] provider listed  Log into  [email or web address] and use [place]'s universal password   Go to> "[facility]"  and find provider  If you still have difficulty reaching the provider, please page the [facility] (Director on Call) for the Hospitalists listed on amion

## 2023-08-31 ENCOUNTER — Inpatient Hospital Stay (HOSPITAL_COMMUNITY)

## 2023-08-31 ENCOUNTER — Encounter (HOSPITAL_COMMUNITY)
Admission: EM | Disposition: A | Discharge status: Home / Self Care | Payer: Self-pay | Source: Home / Self Care | Attending: Internal Medicine

## 2023-08-31 ENCOUNTER — Encounter (HOSPITAL_COMMUNITY): Payer: Self-pay | Admitting: Internal Medicine

## 2023-08-31 ENCOUNTER — Inpatient Hospital Stay (HOSPITAL_COMMUNITY): Admitting: Anesthesiology

## 2023-08-31 ENCOUNTER — Ambulatory Visit: Admitting: Physical Medicine and Rehabilitation

## 2023-08-31 DIAGNOSIS — E785 Hyperlipidemia, unspecified: Secondary | ICD-10-CM

## 2023-08-31 DIAGNOSIS — R918 Other nonspecific abnormal finding of lung field: Secondary | ICD-10-CM | POA: Diagnosis present

## 2023-08-31 DIAGNOSIS — E119 Type 2 diabetes mellitus without complications: Secondary | ICD-10-CM | POA: Diagnosis not present

## 2023-08-31 DIAGNOSIS — I10 Essential (primary) hypertension: Secondary | ICD-10-CM | POA: Diagnosis not present

## 2023-08-31 HISTORY — PX: VIDEO BRONCHOSCOPY WITH ENDOBRONCHIAL NAVIGATION: SHX6175

## 2023-08-31 LAB — HEMOGLOBIN A1C
Hgb A1c MFr Bld: 11.5 % — ABNORMAL HIGH (ref 4.8–5.6)
Hgb A1c MFr Bld: 11.1 % — ABNORMAL HIGH (ref 4.8–5.6)
Mean Plasma Glucose: 283 mg/dL
Mean Plasma Glucose: 272 mg/dL

## 2023-08-31 LAB — GLUCOSE, CAPILLARY
Glucose-Capillary: 197 mg/dL — ABNORMAL HIGH (ref 70–99)
Glucose-Capillary: 200 mg/dL — ABNORMAL HIGH (ref 70–99)
Glucose-Capillary: 180 mg/dL — ABNORMAL HIGH (ref 70–99)
Glucose-Capillary: 222 mg/dL — ABNORMAL HIGH (ref 70–99)
Glucose-Capillary: 353 mg/dL — ABNORMAL HIGH (ref 70–99)
Glucose-Capillary: 359 mg/dL — ABNORMAL HIGH (ref 70–99)
Glucose-Capillary: 323 mg/dL — ABNORMAL HIGH (ref 70–99)

## 2023-08-31 SURGERY — VIDEO BRONCHOSCOPY WITH ENDOBRONCHIAL NAVIGATION
Anesthesia: General | Laterality: Right

## 2023-08-31 MED ORDER — FENTANYL CITRATE (PF) 250 MCG/5ML IJ SOLN
INTRAMUSCULAR | Status: DC | PRN
  Administered 2023-08-31: 100 ug via INTRAVENOUS

## 2023-08-31 MED ORDER — CHLORHEXIDINE GLUCONATE 0.12 % MT SOLN
15.0000 mL | Freq: Once | OROMUCOSAL | Status: AC
  Administered 2023-08-31: 15 mL via OROMUCOSAL

## 2023-08-31 MED ORDER — INSULIN GLARGINE-YFGN 100 UNIT/ML ~~LOC~~ SOLN
15.0000 [IU] | Freq: Every day | SUBCUTANEOUS | Status: DC
  Administered 2023-09-01: 15 [IU] via SUBCUTANEOUS
  Filled 2023-08-31: qty 0.15

## 2023-08-31 MED ORDER — SUGAMMADEX SODIUM 200 MG/2ML IV SOLN
INTRAVENOUS | Status: DC | PRN
  Administered 2023-08-31: 200 mg via INTRAVENOUS

## 2023-08-31 MED ORDER — ROCURONIUM BROMIDE 10 MG/ML (PF) SYRINGE
PREFILLED_SYRINGE | INTRAVENOUS | Status: DC | PRN
  Administered 2023-08-31: 70 mg via INTRAVENOUS

## 2023-08-31 MED ORDER — DEXAMETHASONE SODIUM PHOSPHATE 10 MG/ML IJ SOLN
INTRAMUSCULAR | Status: DC | PRN
  Administered 2023-08-31: 5 mg via INTRAVENOUS

## 2023-08-31 MED ORDER — ONDANSETRON HCL 4 MG/2ML IJ SOLN
INTRAMUSCULAR | Status: DC | PRN
  Administered 2023-08-31: 4 mg via INTRAVENOUS

## 2023-08-31 MED ORDER — DIPHENHYDRAMINE HCL 25 MG PO CAPS
25.0000 mg | ORAL_CAPSULE | Freq: Once | ORAL | Status: AC | PRN
  Administered 2023-08-31: 25 mg via ORAL
  Filled 2023-08-31: qty 1

## 2023-08-31 MED ORDER — ADULT MULTIVITAMIN W/MINERALS CH
1.0000 | ORAL_TABLET | Freq: Every day | ORAL | Status: DC
  Administered 2023-08-31 – 2023-09-02 (×3): 1 via ORAL
  Filled 2023-08-31 (×3): qty 1

## 2023-08-31 MED ORDER — LIDOCAINE 2% (20 MG/ML) 5 ML SYRINGE
INTRAMUSCULAR | Status: DC | PRN
  Administered 2023-08-31: 100 mg via INTRAVENOUS

## 2023-08-31 MED ORDER — PROPOFOL 500 MG/50ML IV EMUL
INTRAVENOUS | Status: DC | PRN
Start: 2023-08-31 — End: 2023-08-31
  Administered 2023-08-31: 125 ug/kg/min via INTRAVENOUS

## 2023-08-31 MED ORDER — PROPOFOL 10 MG/ML IV BOLUS
INTRAVENOUS | Status: DC | PRN
  Administered 2023-08-31: 200 mg via INTRAVENOUS

## 2023-08-31 MED ORDER — CHLORHEXIDINE GLUCONATE 0.12 % MT SOLN
OROMUCOSAL | Status: AC
  Filled 2023-08-31: qty 15

## 2023-08-31 MED ORDER — INSULIN ASPART 100 UNIT/ML IJ SOLN
3.0000 [IU] | Freq: Once | INTRAMUSCULAR | Status: AC
  Administered 2023-08-31: 3 [IU] via SUBCUTANEOUS

## 2023-08-31 SURGICAL SUPPLY — 38 items
ADAPTER BRONCHOSCOPE OLYMPUS (ADAPTER) ×1 IMPLANT
ADAPTER VALVE BIOPSY EBUS (MISCELLANEOUS) IMPLANT
BAG COUNTER SPONGE SURGICOUNT (BAG) ×1 IMPLANT
BRUSH CYTOL CELLEBRITY 1.5X140 (MISCELLANEOUS) ×1 IMPLANT
BRUSH SUPERTRAX BIOPSY (INSTRUMENTS) IMPLANT
BRUSH SUPERTRAX NDL-TIP CYTO (INSTRUMENTS) ×1 IMPLANT
CANISTER SUCTION 3000ML PPV (SUCTIONS) ×1 IMPLANT
CNTNR URN SCR LID CUP LEK RST (MISCELLANEOUS) ×1 IMPLANT
COVER BACK TABLE 60X90IN (DRAPES) ×1 IMPLANT
FILTER STRAW FLUID ASPIR (MISCELLANEOUS) IMPLANT
FORCEPS BIOP 1.5 SINGLE USE (MISCELLANEOUS) ×1 IMPLANT
FORCEPS BIOP SUPERTRX PREMAR (INSTRUMENTS) ×1 IMPLANT
GAUZE SPONGE 4X4 12PLY STRL (GAUZE/BANDAGES/DRESSINGS) ×1 IMPLANT
GLOVE BIO SURGEON STRL SZ7.5 (GLOVE) ×2 IMPLANT
GOWN STRL REUS W/ TWL LRG LVL3 (GOWN DISPOSABLE) ×2 IMPLANT
KIT CLEAN ENDO COMPLIANCE (KITS) ×1 IMPLANT
KIT ILLUMISITE 180 PROCEDURE (KITS) IMPLANT
KIT ILLUMISITE 90 PROCEDURE (KITS) IMPLANT
KIT LOCATABLE GUIDE (CANNULA) IMPLANT
KIT MARKER FIDUCIAL DELIVERY (KITS) IMPLANT
KIT TURNOVER KIT B (KITS) ×1 IMPLANT
MARKER SKIN DUAL TIP RULER LAB (MISCELLANEOUS) ×1 IMPLANT
NDL SUPERTRX PREMARK BIOPSY (NEEDLE) ×1 IMPLANT
NEEDLE SUPERTRX PREMARK BIOPSY (NEEDLE) ×1 IMPLANT
NS IRRIG 1000ML POUR BTL (IV SOLUTION) ×1 IMPLANT
OIL SILICONE PENTAX (PARTS (SERVICE/REPAIRS)) ×1 IMPLANT
PAD ARMBOARD POSITIONER FOAM (MISCELLANEOUS) ×2 IMPLANT
PATCHES PATIENT (LABEL) ×3 IMPLANT
SYR 20ML ECCENTRIC (SYRINGE) ×1 IMPLANT
SYR 20ML LL LF (SYRINGE) ×1 IMPLANT
SYR 50ML SLIP (SYRINGE) ×1 IMPLANT
TOWEL GREEN STERILE FF (TOWEL DISPOSABLE) ×1 IMPLANT
TRAP SPECIMEN MUCUS 40CC (MISCELLANEOUS) IMPLANT
TUBE CONNECTING 20X1/4 (TUBING) ×1 IMPLANT
UNDERPAD 30X36 HEAVY ABSORB (UNDERPADS AND DIAPERS) ×1 IMPLANT
VALVE BIOPSY SINGLE USE (MISCELLANEOUS) ×1 IMPLANT
VALVE SUCTION BRONCHIO DISP (MISCELLANEOUS) ×1 IMPLANT
WATER STERILE IRR 1000ML POUR (IV SOLUTION) ×1 IMPLANT

## 2023-08-31 NOTE — Transfer of Care (Signed)
 Immediate Anesthesia Transfer of Care Note  Patient: [name]  Procedure(s) Performed: VIDEO BRONCHOSCOPY WITH ENDOBRONCHIAL NAVIGATION (Right)  Patient Location: Endoscopy Unit  Anesthesia Type:General  Level of Consciousness: awake, alert , and oriented  Airway & Oxygen Therapy: Patient Spontanous Breathing and Patient connected to face mask oxygen  Post-op Assessment: Report given to RN and Post -op Vital signs reviewed and stable  Post vital signs: Reviewed and stable  Last Vitals:  Vitals Value Taken Time  BP 140/81 08/31/23 1322  Temp    Pulse 73 08/31/23 1324  Resp 30 08/31/23 1324  SpO2 93 % 08/31/23 1324  Vitals shown include unfiled device data.  Last Pain:  Vitals:   08/31/23 1013  TempSrc: Temporal  PainSc: 3       Patients Stated Pain Goal: 0 (08/31/23 0855)  Complications: No notable events documented.

## 2023-08-31 NOTE — Plan of Care (Signed)
   Problem: Education: Goal: Knowledge of General Education information will improve Description Including pain rating scale, medication(s)/side effects and non-pharmacologic comfort measures Outcome: Progressing   Problem: Clinical Measurements: Goal: Ability to maintain clinical measurements within normal limits will improve Outcome: Progressing   Problem: Activity: Goal: Risk for activity intolerance will decrease Outcome: Progressing

## 2023-08-31 NOTE — Op Note (Signed)
 Video Bronchoscopy with Robotic Assisted Bronchoscopic Navigation   Date of Operation: 08/31/2023   Pre-op Diagnosis: Right upper lobe opacity  Post-op Diagnosis: Same  Surgeon: Racheal Buddle  Assistants: None  Anesthesia: General endotracheal anesthesia  Operation: Flexible video fiberoptic bronchoscopy with robotic assistance and biopsies.  Estimated Blood Loss: Minimal  Complications: None  Indications and History: [name] is a 68 y.o. male with history of tobacco use, diabetes and hypertension.  Admitted with fevers and chills for about 1 month duration.  Imaging showed a right upper lobe mixed density opacity.  Recommendation made to obtain culture data and to achieve a tissue diagnosis via robotic assisted navigational bronchoscopy.  The risks, benefits, complications, treatment options and expected outcomes were discussed with the patient.  The possibilities of pneumothorax, pneumonia, reaction to medication, pulmonary aspiration, perforation of a viscus, bleeding, failure to diagnose a condition and creating a complication requiring transfusion or operation were discussed with the patient who freely signed the consent.    Description of Procedure: The patient was seen in the Preoperative Area, was examined and was deemed appropriate to proceed.  The patient was taken to [facility] Endoscopy room 3, identified as [name] and the procedure verified as Flexible Video Fiberoptic Bronchoscopy.  A Time Out was held and the above information confirmed.   Prior to the date of the procedure a high-resolution CT scan of the chest was performed. Utilizing ION software program a virtual tracheobronchial tree was generated to allow the creation of distinct navigation pathways to the patient's parenchymal abnormalities. After being taken to the operating room general anesthesia was initiated and the patient  was orally intubated. The video fiberoptic bronchoscope was introduced via the  endotracheal tube and a general inspection was performed which showed normal right and left lung anatomy. Aspiration of the bilateral mainstems was completed to remove any remaining secretions. Robotic catheter inserted into patient's endotracheal tube.   Target #1 right upper lobe opacity: The distinct navigation pathways prepared prior to this procedure were then utilized to navigate to patient's lesion identified on CT scan. The robotic catheter was secured into place and the vision probe was withdrawn.  Lesion location was approximated using fluoroscopy.  Local registration and targeting was performed using Siem[employer] Cios mobile C-arm three-dimensional imaging. Under fluoroscopic guidance transbronchial needle brushings, transbronchial needle biopsies, and transbronchial forceps biopsies were performed to be sent for cytology and pathology.  Needle-in-lesion was confirmed using Cios mobile C-arm.  A bronchioalveolar lavage was performed in the right upper lobe and sent for microbiology.     At the end of the procedure a general airway inspection was performed and there was no evidence of active bleeding. The bronchoscope was removed.  The patient tolerated the procedure well. There was no significant blood loss and there were no obvious complications. A post-procedural chest x-ray is pending.  Samples Target #1: 1. Transbronchial needle brushings from right upper lobe opacity 2. Transbronchial Wang needle biopsies from right upper lobe opacity 3. Transbronchial forceps biopsies from right upper lobe opacity 4. Bronchoalveolar lavage from right upper lobe   Plans:  The patient will be transferred from endoscopy back to his hospital bed at [facility] when recovered from anesthesia and after chest x-ray is reviewed. We will review the cytology, pathology and microbiology results with the patient when they become available.   Racheal Buddle, MD, PhD 08/31/2023, 1:14 PM [facility] [phone] or if no answer before 7:00PM call [phone] For any issues  after 7:00PM please call eLink [phone]

## 2023-08-31 NOTE — Progress Notes (Signed)
 Initial Nutrition Assessment  INTERVENTION:   -Ensure Plus High Protein po TID, each supplement provides 350 kcal and 20 grams of protein.   -Multivitamin with minerals daily  NUTRITION DIAGNOSIS:   Increased nutrient needs related to chronic illness as evidenced by estimated needs.  GOAL:   Patient will meet greater than or equal to 90% of their needs  MONITOR:   PO intake, Supplement acceptance  REASON FOR ASSESSMENT:   Consult Assessment of nutrition requirement/status  ASSESSMENT:   68 year old male with diabetes mellitus and hypertension who presented to the hospital with fever.  The patient states that his problem started when he developed fevers in the evenings along with night sweats a little over a month ago.  He would take Tylenol  for breakout into sweats and have resolution of the fever.  His symptoms progressed and he lost his appetite and became steadily weaker.  Patient not in room at time of visit, having bronchoscopy today. NPO for procedure. Per chart review, pt has been having fever and weakness over the past month, worsening appetite.   Pt consumed 100% of breakfast yesterday 6/8. MD has already ordered Ensure supplements. Pt also reporting daily alcohol use, likely needs to be on daily MVI.  No weight history available PTA.   Medications reviewed.  Labs reviewed: CBGs: 180-272  NUTRITION - FOCUSED PHYSICAL EXAM:  Unable to complete  Diet Order:   Diet Order             Diet NPO time specified  Diet effective midnight                   EDUCATION NEEDS:   Not appropriate for education at this time  Skin:  Skin Assessment: Reviewed RN Assessment  Last BM:  6/5  Height:   Ht Readings from Last 1 Encounters:  08/31/23 6\' 2"  (1.88 m)    Weight:   Wt Readings from Last 1 Encounters:  08/31/23 99.8 kg    BMI:  Body mass index is 28.25 kg/m.  Estimated Nutritional Needs:   Kcal:  2400-2600  Protein:  105-120g  Fluid:   2.4L/day   Arna Better, MS, RD, LDN Inpatient Clinical Dietitian Contact via Secure chat

## 2023-08-31 NOTE — Interval H&P Note (Signed)
 History and Physical Interval Note:  08/31/2023 11:56 AM  [name]  has presented today for surgery, with the diagnosis of right upper lobe mass.  The various methods of treatment have been discussed with the patient and family. After consideration of risks, benefits and other options for treatment, the patient has consented to  Procedure(s): VIDEO BRONCHOSCOPY WITH ENDOBRONCHIAL NAVIGATION (Right) as a surgical intervention.  The patient's history has been reviewed, patient examined, no change in status, stable for surgery.  I have reviewed the patient's chart and labs.  Questions were answered to the patient's satisfaction.     Denson Flake

## 2023-08-31 NOTE — Anesthesia Preprocedure Evaluation (Addendum)
 Anesthesia Evaluation  Patient identified by MRN, date of birth, ID band Patient awake    Reviewed: Allergy & Precautions, NPO status , Patient's Chart, lab work & pertinent test results  History of Anesthesia Complications Negative for: history of anesthetic complications  Airway Mallampati: III  TM Distance: >3 FB Neck ROM: Full    Dental  (+) Dental Advisory Given, Poor Dentition   Pulmonary neg shortness of breath, neg sleep apnea, neg COPD, neg recent URI, Current Smoker RUL mass   Pulmonary exam normal breath sounds clear to auscultation       Cardiovascular hypertension, Pt. on medications (-) angina (-) Past MI, (-) Cardiac Stents and (-) CABG (-) dysrhythmias  Rhythm:Regular Rate:Normal  HLD   Neuro/Psych neg Seizures PSYCHIATRIC DISORDERS       Neuromuscular disease (chronic low back pain)    GI/Hepatic ,GERD  Medicated,,(+)     substance abuse (2-3 32 ounce beers a day)  alcohol use  Endo/Other  diabetes, Type 2, Oral Hypoglycemic Agents    Renal/GU negative Renal ROS     Musculoskeletal  (+) Arthritis ,    Abdominal   Peds  Hematology  (+) Blood dyscrasia, anemia Lab Results      Component                Value               Date                      WBC                      8.4                 08/30/2023                HGB                      12.1 (L)            08/30/2023                HCT                      38.8 (L)            08/30/2023                MCV                      96.0                08/30/2023                PLT                      219                 08/30/2023              Anesthesia Other Findings   Reproductive/Obstetrics                             Anesthesia Physical Anesthesia Plan  ASA: 3  Anesthesia Plan: General   Post-op Pain Management: Tylenol  PO (pre-op)*   Induction: Intravenous  PONV Risk Score and Plan: 1 and Ondansetron,  Dexamethasone, Propofol infusion, TIVA and  Treatment may vary due to age or medical condition  Airway Management Planned: Oral ETT  Additional Equipment:   Intra-op Plan:   Post-operative Plan: Extubation in OR  Informed Consent: I have reviewed the patients History and Physical, chart, labs and discussed the procedure including the risks, benefits and alternatives for the proposed anesthesia with the patient or authorized representative who has indicated his/her understanding and acceptance.     Dental advisory given  Plan Discussed with: CRNA and Anesthesiologist  Anesthesia Plan Comments: (Risks of general anesthesia discussed including, but not limited to, sore throat, hoarse voice, chipped/damaged teeth, injury to vocal cords, nausea and vomiting, allergic reactions, lung infection, heart attack, stroke, and death. All questions answered. )        Anesthesia Quick Evaluation

## 2023-08-31 NOTE — Anesthesia Postprocedure Evaluation (Signed)
 Anesthesia Post Note  Patient: Avinash Bechler  Procedure(s) Performed: VIDEO BRONCHOSCOPY WITH ENDOBRONCHIAL NAVIGATION (Right)     Patient location during evaluation: PACU Anesthesia Type: General Level of consciousness: awake and alert and oriented Pain management: pain level controlled Vital Signs Assessment: post-procedure vital signs reviewed and stable Respiratory status: spontaneous breathing, nonlabored ventilation and respiratory function stable Cardiovascular status: blood pressure returned to baseline and stable Postop Assessment: no apparent nausea or vomiting Anesthetic complications: no   No notable events documented.  Last Vitals:  Vitals:   08/31/23 1352 08/31/23 1353  BP: 126/69   Pulse: 64 61  Resp: 18 20  Temp:    SpO2: 92% 97%    Last Pain:  Vitals:   08/31/23 1352  TempSrc:   PainSc: 0-No pain                 Lloyd Cullinan A.

## 2023-08-31 NOTE — Inpatient Diabetes Management (Addendum)
 Inpatient Diabetes Program Recommendations  AACE/ADA: New Consensus Statement on Inpatient Glycemic Control (2015)  Target Ranges:  Prepandial:   less than 140 mg/dL      Peak postprandial:   less than 180 mg/dL (1-2 hours)      Critically ill patients:  140 - 180 mg/dL   Lab Results  Component Value Date   GLUCAP 200 (H) 08/31/2023   HGBA1C 9.5 (H) 04/15/2019    Review of Glycemic Control  Latest Reference Range & Units 08/30/23 08:42 08/30/23 13:51 08/30/23 16:35 08/30/23 20:57 08/31/23 07:17 08/31/23 10:29  Glucose-Capillary 70 - 99 mg/dL 161 (H) 096 (H) 045 (H) 272 (H) 197 (H) 200 (H)   Diabetes history: DM 2 Outpatient Diabetes medications: Glyburide  5 mg Daily, Metformin  1000 mg bid, Januvia  50 mg Daily Current orders for Inpatient glycemic control:  Semglee 10 units Tradjenta 5 mg Daily Novolog  0-15 units tid + hs Ensure Plus high protein tid between meals (19 grams of carbohydrate)  Inpatient Diabetes Program Recommendations:    -   Increase Semglee to 15 units -   Add Novolog  4 units tid meal coverage if eating >50% of meals  Thanks,  Eloise Hake RN, MSN, BC-ADM Inpatient Diabetes Coordinator Team Pager [phone] (8a-5p)

## 2023-08-31 NOTE — Anesthesia Procedure Notes (Signed)
 Procedure Name: Intubation Date/Time: 08/31/2023 12:29 PM  Performed by: Raymund Calix, CRNAPre-anesthesia Checklist: Patient identified, Emergency Drugs available, Suction available and Patient being monitored Patient Re-evaluated:Patient Re-evaluated prior to induction Oxygen Delivery Method: Circle system utilized Preoxygenation: Pre-oxygenation with 100% oxygen Induction Type: IV induction Ventilation: Mask ventilation without difficulty and Oral airway inserted - appropriate to patient size Laryngoscope Size: Mac and 4 Grade View: Grade II Tube type: Oral Tube size: 8.5 mm Number of attempts: 1 Airway Equipment and Method: Stylet and Oral airway Placement Confirmation: ETT inserted through vocal cords under direct vision, positive ETCO2 and breath sounds checked- equal and bilateral Secured at: 23 cm Tube secured with: Tape Dental Injury: Teeth and Oropharynx as per pre-operative assessment

## 2023-08-31 NOTE — Progress Notes (Signed)
 CBG 200. Dr Leighton Punches notified (anesthesiologist)- order for 3 units Novolog , same given.

## 2023-08-31 NOTE — Progress Notes (Signed)
 [facility] Progress Note  Patient: [name]     GMW:[id number]  DOA: 08/28/2023   PCP: [facility]       Brief hospital course: This is a 68 year old male with diabetes mellitus and hypertension who presented to the hospital with fever.  The patient states that his problem started when he developed fevers in the evenings along with night sweats a little over a month ago.  He would take Tylenol  for breakout into sweats and have resolution of the fever.  His symptoms progressed and he lost his appetite and became steadily weaker.  He saw his primary care and had blood work done which did not show any etiology.  His symptoms persisted and he eventually decided to come to the ED on 6/5. In the ED: Temp of 100.1 which rose to 101.7. WBC count normal at 9.5. CT scan of the chest revealed 4 x 4 centimeter right upper lobe consolidation.  Initially started on ceftriaxone  and azithromycin  and then Unasyn .  Subjective:   No fever since 6/8. Evaluated this AM before bronchoscopy. He had no complaints.   Assessment and Plan: Principal Problem: Fever  Right upper lobe consolidation - Rocephin , azithromycin  given 1 x in ED and then started on Unasyn  - Respiratory panel, influenza, RSV and COVID negative - UA negative - Blood cultures NGTD so far - bronch completed- BAL, biopsy of RUL and Brushing from RLU performed - No fever in 24 hrs- continue Unasyn    Active Problems:  Loss of appetite with generalized weakness - Secondary to the fevers, suspected underlying lung infection versus adenocarcinoma - 6/7 Ensure 3 times daily added - 69  nutrition eval> cont Ensure TID and add MVI with minerals -  improving- Can DC IVF today   Diabetes mellitus type 2 - Takes metformin , glyburide  and Januvia  - Have resumed Januvia  which is replaced by Tradjenta by our pharmacy - A1c 11.1 on 6/8 - have had to add semglee in the hospital due to elevated glucose - cont  SSI  Hypertension - according to medication list, he takes lisinopril  HCTZ 10/12.5 daily and HCTZ 25 mg every other day - Resumed lisinopril  and holding hydrochlorothiazide - high risk for dehydration with uncontrolled DM     Code Status: Full Code Total time on patient care: 35 DVT prophylaxis:  SCDs Start: 08/28/23 1851     Objective:   Vitals:   08/31/23 1342 08/31/23 1352 08/31/23 1353 08/31/23 1429  BP: 134/77 126/69  136/66  Pulse: 68 64 61 64  Resp: (!) 25 18 20 20   Temp:    98.1 F (36.7 C)  TempSrc:      SpO2: 95% 92% 97% 100%  Weight:      Height:       Filed Weights   08/28/23 1500 08/31/23 1013  Weight: 98.7 kg 99.8 kg   Exam: General exam: Appears comfortable  HEENT: oral mucosa moist Respiratory system: Clear to auscultation.  Cardiovascular system: S1 & S2 heard  Gastrointestinal system: Abdomen soft, non-tender, nondistended. Normal bowel sounds   Extremities: No cyanosis, clubbing or edema Psychiatry:  Mood & affect appropriate.    CBC: Recent Labs  Lab 08/28/23 0933 08/30/23 0531  WBC 9.5 8.4  NEUTROABS 6.8  --   HGB 13.2 12.1*  HCT 39.4 38.8*  MCV 90.6 96.0  PLT 214 219   Basic Metabolic Panel: Recent Labs  Lab 08/28/23 0933 08/30/23 0531  NA 137 140  K 4.7 4.0  CL 96* 104  CO2  26 24  GLUCOSE 288* 219*  BUN 11 10  CREATININE 1.07 1.05  CALCIUM  10.5* 8.5*     Scheduled Meds:  atorvastatin   10 mg Oral Daily   feeding supplement  237 mL Oral TID BM   insulin  aspart  0-15 Units Subcutaneous TID AC & HS   insulin  glargine-yfgn  10 Units Subcutaneous Q24H   linagliptin  5 mg Oral Daily   lisinopril   20 mg Oral Daily   multivitamin with minerals  1 tablet Oral Daily    Imaging and lab data personally reviewed   Author: Sirron Francesconi  08/31/2023 3:21 PM  To contact [facility]>   Check the care team in [facility] and look for the attending/consulting [facility] provider listed  Log into [email or web address] and use [place]'s universal  password   Go to> "[facility]"  and find provider  If you still have difficulty reaching the provider, please page the [facility] (Director on Call) for the Hospitalists listed on amion

## 2023-08-31 NOTE — Progress Notes (Signed)
   08/31/23 1037  TOC Brief Assessment  Insurance and Status Reviewed  Patient has primary care physician Yes  Home environment has been reviewed home alone  Prior level of function: independent  Prior/Current Home Services No current home services  Social Drivers of Health Review SDOH reviewed no interventions necessary  Readmission risk has been reviewed Yes  Transition of care needs no transition of care needs at this time

## 2023-09-01 DIAGNOSIS — R509 Fever, unspecified: Secondary | ICD-10-CM | POA: Diagnosis not present

## 2023-09-01 LAB — GLUCOSE, CAPILLARY
Glucose-Capillary: 238 mg/dL — ABNORMAL HIGH (ref 70–99)
Glucose-Capillary: 272 mg/dL — ABNORMAL HIGH (ref 70–99)
Glucose-Capillary: 201 mg/dL — ABNORMAL HIGH (ref 70–99)
Glucose-Capillary: 218 mg/dL — ABNORMAL HIGH (ref 70–99)

## 2023-09-01 LAB — CYTOLOGY - NON PAP

## 2023-09-01 MED ORDER — ENSURE MAX PROTEIN PO LIQD
11.0000 [oz_av] | Freq: Two times a day (BID) | ORAL | Status: DC
  Administered 2023-09-01 – 2023-09-02 (×3): 11 [oz_av] via ORAL
  Filled 2023-09-01 (×3): qty 330

## 2023-09-01 MED ORDER — AMOXICILLIN-POT CLAVULANATE 875-125 MG PO TABS
1.0000 | ORAL_TABLET | Freq: Two times a day (BID) | ORAL | Status: DC
  Administered 2023-09-01 – 2023-09-02 (×3): 1 via ORAL
  Filled 2023-09-01 (×3): qty 1

## 2023-09-01 MED ORDER — INSULIN ASPART 100 UNIT/ML IJ SOLN
3.0000 [IU] | Freq: Three times a day (TID) | INTRAMUSCULAR | Status: DC
  Administered 2023-09-02: 3 [IU] via SUBCUTANEOUS

## 2023-09-01 NOTE — Progress Notes (Signed)
 NAME:  [name], MRN:  [id number], DOB:  December 16, 1955, LOS: 4 ADMISSION DATE:  08/28/2023, CONSULTATION DATE: 08/29/2023 REFERRING MD:  Dr Christiane Cowing, CHIEF COMPLAINT: Abnormal CT chest  History of Present Illness:  68 year old male with history of tobacco use (30 pack years), diabetes, hypertension, chronic low back pain.  He was seen in the emergency department 6/6 for evaluation of fevers, chills on and off for about 1 month.  These were worse over the 3-4 days prior to admission, sometimes associated with headache, abdominal pain.  WBC normal.  Head CT was normal.  A CT scan of his chest, abdomen, pelvis was performed in the ED that showed an ill-defined heterogeneous groundglass rounded consolidation in the medial right upper lobe abutting the pleura 4.4 x 4.0 cm, infectious versus other.  No apparent mediastinal or abdominal adenopathy.  COVID-19, influenza, RSV were all negative.  He was started on ceftriaxone  and azithromycin , subsequently changed to Unasyn .  Remains on room air  Pertinent  Medical History   Past Medical History:  Diagnosis Date   Arthritis    Blood transfusion without reported diagnosis    30 yrs ago   Chronic low back pain    Diabetes (HCC)    Hypertension    Insomnia    Muscle cramps    Skin fissure    Uncontrolled type 2 diabetes mellitus without complication, without long-term current use of insulin  01/24/2015    Significant Hospital Events: Including procedures, antibiotic start and stop dates in addition to other pertinent events   CT chest, abdomen, pelvis 6/6 >> heterogeneous groundglass mixed density 4.4 x 4.0 cm right upper lobe rounded opacity with air bronchograms, uncertain etiology consider infectious versus possible adenocarcinoma.  No mediastinal or abdominal lymphadenopathy.  There are some areas of pleural-based thickening versus nodularity at the bilateral bases 6/9 - underwent navigational bronchoscopy with biopsy and BAL  Interim History /  Subjective:   On room air Afebrile Feeling back to his baseline. No issues post-procedure  Objective    Blood pressure (!) 164/78, pulse 62, temperature 98.5 F (36.9 C), temperature source Oral, resp. rate 18, height 6\' 2"  (1.88 m), weight 99.8 kg, SpO2 99%.        Intake/Output Summary (Last 24 hours) at 09/01/2023 0805 Last data filed at 09/01/2023 0056 Gross per 24 hour  Intake 1050 ml  Output 5 ml  Net 1045 ml   Filed Weights   08/28/23 1500 08/31/23 1013  Weight: 98.7 kg 99.8 kg    Examination: General: no acute distress, sitting up on edge of bed HENT: [place]/AT, moist mucous membranes Lungs: Clear bilaterally Cardiovascular: Regular, no murmur Abdomen: Soft, nondistended, nontender Extremities: No edema Neuro: Awake, alert, appropriate.  Follows commands, interacts.  No deficits GU: Deferred  Resolved problem list   Assessment and Plan  Right upper lobe mixed density opacity  - differential includes infectious pneumonia vs malignancy - s/p navigational biopsy 6/9, results pending - Gram stain is negative on BAL - transition to oral augmentin 875mg  twice daily for 6 days to complete 10 days of antibiotics - will arrange outpatient follow up with Dr. Baldwin Levee  PCCM Will sign off.  Labs   CBC: Recent Labs  Lab 08/28/23 0933 08/30/23 0531  WBC 9.5 8.4  NEUTROABS 6.8  --   HGB 13.2 12.1*  HCT 39.4 38.8*  MCV 90.6 96.0  PLT 214 219    Basic Metabolic Panel: Recent Labs  Lab 08/28/23 0933 08/30/23 0531  NA 137 140  K 4.7 4.0  CL 96* 104  CO2 26 24  GLUCOSE 288* 219*  BUN 11 10  CREATININE 1.07 1.05  CALCIUM  10.5* 8.5*   GFR: Estimated Creatinine Clearance: 88.5 mL/min (by C-G formula based on SCr of 1.05 mg/dL). Recent Labs  Lab 08/28/23 0933 08/28/23 1132 08/30/23 0531  WBC 9.5  --  8.4  LATICACIDVEN 1.1 1.1  --     Liver Function Tests: Recent Labs  Lab 08/28/23 0933  AST 15  ALT 17  ALKPHOS 58  BILITOT 0.5  PROT 7.6  ALBUMIN  4.1   Recent Labs  Lab 08/28/23 0933  LIPASE 27    HbA1C: Hgb A1c MFr Bld  Date/Time Value Ref Range Status  08/30/2023 05:31 AM 11.1 (H) 4.8 - 5.6 % Final    Comment:    (NOTE)         Prediabetes: 5.7 - 6.4         Diabetes: >6.4         Glycemic control for adults with diabetes: <7.0   08/29/2023 05:26 AM 11.5 (H) 4.8 - 5.6 % Final    Comment:    (NOTE)         Prediabetes: 5.7 - 6.4         Diabetes: >6.4         Glycemic control for adults with diabetes: <7.0     CBG: Recent Labs  Lab 08/31/23 1433 08/31/23 1643 08/31/23 1808 08/31/23 2058 09/01/23 0731  GLUCAP 222* 353* 359* 323* 238*       Critical care time: NA     Duaine German, MD [facility] Office: [phone]   See Amion for personal pager PCCM on call pager [phone] until 7pm. Please call Elink 7p-7a. [phone]

## 2023-09-01 NOTE — Plan of Care (Signed)
  Problem: Education: Goal: Knowledge of General Education information will improve Description: Including pain rating scale, medication(s)/side effects and non-pharmacologic comfort measures Outcome: Progressing   Problem: Health Behavior/Discharge Planning: Goal: Ability to manage health-related needs will improve Outcome: Progressing   Problem: Clinical Measurements: Goal: Ability to maintain clinical measurements within normal limits will improve Outcome: Progressing Goal: Respiratory complications will improve Outcome: Adequate for Discharge

## 2023-09-01 NOTE — Inpatient Diabetes Management (Signed)
 Inpatient Diabetes Program Recommendations  AACE/ADA: New Consensus Statement on Inpatient Glycemic Control (2015)  Target Ranges:  Prepandial:   less than 140 mg/dL      Peak postprandial:   less than 180 mg/dL (1-2 hours)      Critically ill patients:  140 - 180 mg/dL   Lab Results  Component Value Date   GLUCAP 272 (H) 09/01/2023   HGBA1C 11.1 (H) 08/30/2023    Review of Glycemic Control  Diabetes history: DM2 Outpatient Diabetes medications: metformin  1000 mg BID Current orders for Inpatient glycemic control: Semglee 15 at bedtime, Novolog  0-15 TID with meals and 0-5 HS, tradjenta 5 mg daily  HgbA1C - 11.1%  Inpatient Diabetes Program Recommendations:    Consider adding Novolog  4 units TID with meals if eating > 50%.  Spoke with pt about his diabetes and hojme regimen for diabetes control. Has PCP, glucose meter and supplies  Discussed A1C results (11.1% on ) and explained that current A1C indicates an average glucose of 272 mg/dl over the past 2-3 months. Discussed glucose and A1C goals. Discussed importance of checking CBGs and maintaining good CBG control to prevent long-term and short-term complications. Explained how hyperglycemia leads to damage within blood vessels which lead to the common complications seen with uncontrolled diabetes. Stressed to the patient the importance of improving glycemic control to prevent further complications from uncontrolled diabetes. Discussed impact of nutrition, exercise, stress, sickness, and medications on diabetes control.  Discussed carbohydrates, carbohydrate goals per day and meal, along with portion sizes. Encouraged patient to check glucose 4 times per day (before meals and at bedtime) and to keep a log book of glucose readings and DM medication taken which patient will need to take to doctor appointments. Explained how the doctor can use the log book to continue to make adjustments with DM medications if needed.   Long discussion  regarding importance of going home on insulin . Pt was thinking insulin  in vial, using syringes. Demonstrated insulin  pen use. Pt familiar with pen from using Ozempic and was willing to use insulin  pen. Educated pt on insulin  pen use at home. Reviewed all steps if insulin  pen including attachment of needle, 2-unit air shot, dialing up dose, giving injection, removing needle, disposal of sharps, storage of unused insulin , disposal of insulin  etc. Patient able to provide successful return demonstration. Also reviewed troubleshooting with insulin  pen. MD to give patient Rxs for insulin  pens and insulin  pen needles.  Patient verbalized understanding of information discussed and reports no further questions at this time related to diabetes.  Will follow-up in am.   Thank you. Joni Net, RD, LDN, CDCES Inpatient Diabetes Coordinator [phone]

## 2023-09-01 NOTE — Progress Notes (Addendum)
 [facility] Progress Note  Patient: [name]     HQI:[id number]  DOA: 08/28/2023   PCP: [facility]       Brief hospital course: This is a 68 year old male with diabetes mellitus and hypertension who presented to the hospital with fever.  The patient states that his problem started when he developed fevers in the evenings along with night sweats a little over a month ago.  He would take Tylenol  for breakout into sweats and have resolution of the fever.  His symptoms progressed and he lost his appetite and became steadily weaker.  He saw his primary care and had blood work done which did not show any etiology.  His symptoms persisted and he eventually decided to come to the ED on 6/5. In the ED: Temp of 100.1 which rose to 101.7. WBC count normal at 9.5. CT scan of the chest revealed 4 x 4 centimeter right upper lobe consolidation.  Initially started on ceftriaxone  and azithromycin  and then Unasyn .  Regarding his diabetes, hemoglobin A1c is 11.1. Patient continues to have uncontrolled blood sugars and has been refusing to take insulin  as outpatient.  Today, he is spoken with diabetes coordinator, Sallyanne Creamer and finally is in agreement with taking insulin .  Since this will be the first time he is ever taken insulin , I will ask the nurses to start teaching him and allowing him to inject himself today.  I have also requested a dietitian consult to help him understand how to adhere to a diabetic diet.  If he is able to do this relatively well, he can be discharged tomorrow  Subjective:   No fever since 6/8.  We discussed that his blood sugars are uncontrolled and he still refuses to take insulin  as outpatient.  Assessment and Plan: Principal Problem: Fever  Right upper lobe consolidation - Rocephin , azithromycin  given 1 x in ED and then started on Unasyn  - Respiratory panel, influenza, RSV and COVID negative - UA negative - Blood cultures NGTD so far - bronch completed- BAL,  biopsy of RUL and Brushing from RLU performed - Appreciate pulmonology eval- Unasyn  (day 4 today)) changed to Augmentin for total of a 10-day course    Active Problems:  Diabetes mellitus type 2 - Takes metformin , glyburide  and Januvia  - Have resumed Januvia  which is replaced by Tradjenta by our pharmacy - A1c 11.1 on 6/8 - have had to add semglee in the hospital due to elevated glucose and we have been giving him NovoLog  based on a sliding scale - Semglee increased from 10 units to 15 units for tonight as sugars are still in the 300s - The patient is finally in agreement for insulin  at home and therefore we will be prescribing this for him-will start teaching today and allow him to inject himself here in the hospital  Loss of appetite with generalized weakness - Secondary to the fevers, suspected underlying lung infection versus adenocarcinoma - 6/7 Ensure 3 times daily added - 6/9  nutrition eval> cont Ensure TID and add MVI with minerals -6/10 nutrition eval> educated further about maintaining a strict diabetic diet  Hypertension - according to medication list, he takes lisinopril  HCTZ 10/12.5 daily and HCTZ 25 mg every other day - Resumed lisinopril  and holding hydrochlorothiazide - high risk for dehydration with uncontrolled DM-will need to discontinue this when he is discharged     Code Status: Full Code Total time on patient care: 35 DVT prophylaxis:  SCDs Start: 08/28/23 1851     Objective:  Vitals:   08/31/23 2001 09/01/23 0549 09/01/23 0920 09/01/23 1224  BP: (!) 164/90 (!) 164/78 (!) 160/79 (!) 177/72  Pulse: 77 62  (!) 50  Resp: 18 18  18   Temp: 98.5 F (36.9 C) 98.5 F (36.9 C)  98.1 F (36.7 C)  TempSrc:  Oral  Oral  SpO2: 96% 99%  100%  Weight:      Height:       Filed Weights   08/28/23 1500 08/31/23 1013  Weight: 98.7 kg 99.8 kg   Exam: General exam: Appears comfortable  HEENT: oral mucosa moist Respiratory system: Clear to auscultation.   Cardiovascular system: S1 & S2 heard  Gastrointestinal system: Abdomen soft, non-tender, nondistended. Normal bowel sounds   Extremities: No cyanosis, clubbing or edema Psychiatry:  Mood & affect appropriate.    CBC: Recent Labs  Lab 08/28/23 0933 08/30/23 0531  WBC 9.5 8.4  NEUTROABS 6.8  --   HGB 13.2 12.1*  HCT 39.4 38.8*  MCV 90.6 96.0  PLT 214 219   Basic Metabolic Panel: Recent Labs  Lab 08/28/23 0933 08/30/23 0531  NA 137 140  K 4.7 4.0  CL 96* 104  CO2 26 24  GLUCOSE 288* 219*  BUN 11 10  CREATININE 1.07 1.05  CALCIUM  10.5* 8.5*     Scheduled Meds:  amoxicillin -clavulanate  1 tablet Oral Q12H   atorvastatin   10 mg Oral Daily   insulin  aspart  0-15 Units Subcutaneous TID AC & HS   insulin  glargine-yfgn  15 Units Subcutaneous QHS   linagliptin  5 mg Oral Daily   lisinopril   20 mg Oral Daily   multivitamin with minerals  1 tablet Oral Daily   Ensure Max Protein  11 oz Oral BID    Imaging and lab data personally reviewed   Author: Marijean Montanye  09/01/2023 4:17 PM  To contact [facility]>   Check the care team in [facility] and look for the attending/consulting [facility] provider listed  Log into [email or web address] and use [place]'s universal password   Go to> "[facility]"  and find provider  If you still have difficulty reaching the provider, please page the [facility] (Director on Call) for the Hospitalists listed on amion

## 2023-09-01 NOTE — Progress Notes (Addendum)
 Nutrition Follow-up  INTERVENTION:   -Ensure MAX Protein po BID, each supplement provides 150 kcal and 30 grams of protein   -D/c Ensure Plus High Protein with increasing CBGs  -Multivitamin with minerals daily  -Provided "Carbohydrate Counting" handout, reviewed with patient  NUTRITION DIAGNOSIS:   Increased nutrient needs related to chronic illness as evidenced by estimated needs.  Ongoing.  GOAL:   Patient will meet greater than or equal to 90% of their needs  Progressing.  MONITOR:   PO intake, Supplement acceptance  REASON FOR ASSESSMENT:   Consult Diet education  ASSESSMENT:   68 year old male with diabetes mellitus and hypertension who presented to the hospital with fever.  The patient states that his problem started when he developed fevers in the evenings along with night sweats a little over a month ago.  He would take Tylenol  for breakout into sweats and have resolution of the fever.  His symptoms progressed and he lost his appetite and became steadily weaker.  6/9: s/p bronchoscopy,   Patient in room, reports feeling fine. Feels like the antibiotics are helping him. Endorses not feeling well for over a month, having consistent fevers and headaches. Pt states for days he was not eating. Reports symptoms are much better now. Now concerned about his diabetes. Does not want to use insulin . Provided pt a handout about carbohydrate counting. Main goal is to limit sugary beverages. States he was drinking the Ensure and RD will switch to low carb, high protein shake Ensure Max.  Pt reports only eating 2 meals a day. Does not feel hungry in the morning so recommended a protein shake in the morning.  Reports having back pain that he was supposed to be evaluated for today prior to hospitalization. Pt wants to become physically active again, wants to bike or use the elliptical.   Pt reports UBW ~160 lbs.  Current weight is above this: 220 lbs.  Medications: Multivitamin  with minerals daily  Labs reviewed:  CBGs: 238-359  NUTRITION - FOCUSED PHYSICAL EXAM:  Flowsheet Row Most Recent Value  Orbital Region No depletion  Upper Arm Region Mild depletion  Thoracic and Lumbar Region No depletion  Buccal Region No depletion  Temple Region No depletion  Clavicle Bone Region No depletion  Clavicle and Acromion Bone Region No depletion  Scapular Bone Region Mild depletion  Dorsal Hand No depletion  Patellar Region No depletion  Anterior Thigh Region No depletion  Posterior Calf Region No depletion  Edema (RD Assessment) None  Hair Reviewed  Eyes Reviewed  Mouth Reviewed  Skin Reviewed  Nails Reviewed       Diet Order:   Diet Order             Diet heart healthy/carb modified Room service appropriate? Yes; Fluid consistency: Thin  Diet effective now                   EDUCATION NEEDS:   Education needs have been addressed  Skin:  Skin Assessment: Reviewed RN Assessment  Last BM:  6/5  Height:   Ht Readings from Last 1 Encounters:  08/31/23 6\' 2"  (1.88 m)    Weight:   Wt Readings from Last 1 Encounters:  08/31/23 99.8 kg    BMI:  Body mass index is 28.25 kg/m.  Estimated Nutritional Needs:   Kcal:  2400-2600  Protein:  105-120g  Fluid:  2.4L/day   Arna Better, MS, RD, LDN Inpatient Clinical Dietitian Contact via Secure chat

## 2023-09-01 NOTE — Plan of Care (Signed)
 The patient's sugars have been in the 300s.  Upon entry to the room I noticed that he is drinking cranberry juice causing his sugars to rise further.  Have notified the RN to stop giving him juice and foods with concentrated sugar.

## 2023-09-02 ENCOUNTER — Encounter (HOSPITAL_COMMUNITY): Payer: Self-pay | Admitting: Emergency Medicine

## 2023-09-02 ENCOUNTER — Other Ambulatory Visit (HOSPITAL_COMMUNITY): Payer: Self-pay

## 2023-09-02 DIAGNOSIS — E119 Type 2 diabetes mellitus without complications: Secondary | ICD-10-CM

## 2023-09-02 DIAGNOSIS — R918 Other nonspecific abnormal finding of lung field: Secondary | ICD-10-CM | POA: Diagnosis not present

## 2023-09-02 LAB — CULTURE, BLOOD (ROUTINE X 2)
Culture: NO GROWTH
Culture: NO GROWTH
Special Requests: ADEQUATE
Special Requests: ADEQUATE

## 2023-09-02 LAB — ACID FAST SMEAR (AFB, MYCOBACTERIA): Acid Fast Smear: NEGATIVE

## 2023-09-02 LAB — GLUCOSE, CAPILLARY: Glucose-Capillary: 233 mg/dL — ABNORMAL HIGH (ref 70–99)

## 2023-09-02 MED ORDER — INSULIN ASPART 100 UNIT/ML FLEXPEN
4.0000 [IU] | PEN_INJECTOR | Freq: Three times a day (TID) | SUBCUTANEOUS | 0 refills | Status: AC
Start: 2023-09-02 — End: ?
  Filled 2023-09-02: qty 6, 30d supply, fill #0
  Filled 2023-09-28: qty 6, 50d supply, fill #1

## 2023-09-02 MED ORDER — INSULIN GLARGINE 100 UNIT/ML SOLOSTAR PEN
15.0000 [IU] | PEN_INJECTOR | Freq: Every day | SUBCUTANEOUS | 0 refills | Status: AC
  Filled 2023-09-02: qty 6, 30d supply, fill #0
  Filled 2023-09-28: qty 6, 40d supply, fill #1

## 2023-09-02 MED ORDER — SODIUM CHLORIDE 0.9% FLUSH: 3.0000 mL | INTRAVENOUS | Status: DC | PRN

## 2023-09-02 MED ORDER — PEN NEEDLES 31G X 5 MM MISC
1.0000 | Freq: Three times a day (TID) | 0 refills | Status: DC
Start: 2023-09-02 — End: 2023-11-12
  Filled 2023-09-02: qty 100, 30d supply, fill #0

## 2023-09-02 MED ORDER — BLOOD GLUCOSE TEST VI STRP
1.0000 | ORAL_STRIP | Freq: Three times a day (TID) | 0 refills | Status: AC
  Filled 2023-09-02: qty 100, 34d supply, fill #0

## 2023-09-02 MED ORDER — BLOOD GLUCOSE MONITOR SYSTEM W/DEVICE KIT
1.0000 | PACK | Freq: Three times a day (TID) | 0 refills | Status: AC
Start: 2023-09-02 — End: ?
  Filled 2023-09-02: qty 1, 30d supply, fill #0

## 2023-09-02 MED ORDER — AMOXICILLIN-POT CLAVULANATE 875-125 MG PO TABS
1.0000 | ORAL_TABLET | Freq: Two times a day (BID) | ORAL | 0 refills | Status: AC
  Filled 2023-09-02: qty 10, 5d supply, fill #0

## 2023-09-02 MED ORDER — LANCETS MISC
1.0000 | Freq: Three times a day (TID) | 0 refills | Status: AC
  Filled 2023-09-02: qty 100, 30d supply, fill #0

## 2023-09-02 MED ORDER — HYDROCHLOROTHIAZIDE 12.5 MG PO TABS
12.5000 mg | ORAL_TABLET | Freq: Every day | ORAL | Status: DC
  Administered 2023-09-02: 12.5 mg via ORAL
  Filled 2023-09-02: qty 1

## 2023-09-02 MED ORDER — LANCET DEVICE MISC
1.0000 | Freq: Three times a day (TID) | 0 refills | Status: AC
Start: 2023-09-02 — End: ?
  Filled 2023-09-02: qty 1, fill #0

## 2023-09-02 MED ORDER — SODIUM CHLORIDE 0.9% FLUSH
3.0000 mL | Freq: Two times a day (BID) | INTRAVENOUS | Status: DC
  Administered 2023-09-02: 3 mL via INTRAVENOUS

## 2023-09-02 MED ORDER — LISINOPRIL 20 MG PO TABS
40.0000 mg | ORAL_TABLET | Freq: Every day | ORAL | Status: DC
  Administered 2023-09-02: 40 mg via ORAL
  Filled 2023-09-02: qty 2

## 2023-09-02 NOTE — Discharge Summary (Signed)
 Physician Discharge Summary  Bayard More YQM:[id number] DOB: Nov 15, 1955 DOA: 08/28/2023  PCP: [facility] [address] [facility]  Admit date: 08/28/2023 Discharge date: 09/02/2023  Admitted From: Home Disposition:  Home   Recommendations for Outpatient Follow-up:  Follow up with PCP in 1 week Follow-up with Dr. Baldwin Levee outpatient, they will arrange outpatient follow-up  Discharge Condition: Stable CODE STATUS: Full  Diet recommendation: Carb modified   Brief/Interim Summary: [name] is a 68 year old male with diabetes mellitus and hypertension who presented to the hospital with fever.  The patient states that his problem started when he developed fevers in the evenings along with night sweats a little over a month ago.  He would take Tylenol  for breakout into sweats and have resolution of the fever.  His symptoms progressed and he lost his appetite and became steadily weaker.  He saw his primary care and had blood work done which did not show any etiology.  His symptoms persisted and he eventually decided to come to the ED on 6/5. In the ED: Temp of 100.1 which rose to 101.7. WBC count normal at 9.5. CT scan of the chest revealed 4 x 4 centimeter right upper lobe consolidation.  Initially started on ceftriaxone  and azithromycin  and then Unasyn .  Respiratory panel, influenza, RSV, COVID was negative. He underwent bronchoscopy and underwent BAL, biopsy of right upper lobe on 6/9.  His antibiotic was switched to Augmentin for total 10-day course.  Patient was also started on diabetes due to uncontrolled diabetes with hyperglycemia, A1c 11.1.  He underwent insulin  teaching prior to discharge.  Discharge Diagnoses:   Principal Problem:   Mass of upper lobe of right lung Active Problems:   Essential hypertension   Smoker   Fever   DM (diabetes mellitus), type 2 [facility])  Discharge Instructions  Discharge Instructions     Call MD for:  difficulty breathing, headache or visual disturbances   Complete  by: As directed    Call MD for:  extreme fatigue   Complete by: As directed    Call MD for:  persistant dizziness or light-headedness   Complete by: As directed    Call MD for:  persistant nausea and vomiting   Complete by: As directed    Call MD for:  severe uncontrolled pain   Complete by: As directed    Call MD for:  temperature >100.4   Complete by: As directed    Diet Carb Modified   Complete by: As directed    Discharge instructions   Complete by: As directed    You were cared for by a hospitalist during your hospital stay. If you have any questions about your discharge medications or the care you received while you were in the hospital after you are discharged, you can call the unit and ask to speak with the hospitalist on call if the hospitalist that took care of you is not available. Once you are discharged, your primary care physician will handle any further medical issues. Please note that NO REFILLS for any discharge medications will be authorized once you are discharged, as it is imperative that you return to your primary care physician (or establish a relationship with a primary care physician if you do not have one) for your aftercare needs so that they can reassess your need for medications and monitor your lab values.   Increase activity slowly   Complete by: As directed       Allergies as of 09/02/2023       Reactions  Iodinated Contrast Media Itching, Other (See Comments)   Pruritis and a single hive following first time administration of IV contrast.  Benadryl administered with improvement of symptoms.        Medication List     STOP taking these medications    cephALEXin  500 MG capsule Commonly known as: KEFLEX    cyclobenzaprine  10 MG tablet Commonly known as: FLEXERIL    glyBURIDE  5 MG tablet Commonly known as: DIABETA    lisinopril -hydrochlorothiazide  10-12.5 MG tablet Commonly known as: ZESTORETIC    meloxicam  7.5 MG tablet Commonly known as:  MOBIC    sitaGLIPtin  50 MG tablet Commonly known as: Januvia        TAKE these medications    Accu-Chek Guide Test test strip Generic drug: glucose blood SMARTSIG:Strip(s) What changed: Another medication with the same name was added. Make sure you understand how and when to take each.   BLOOD GLUCOSE TEST STRIPS Strp 1 each by Does not apply route 3 (three) times daily. Use as directed to check blood sugar. May dispense any manufacturer covered by patient's insurance and fits patient's device. What changed: You were already taking a medication with the same name, and this prescription was added. Make sure you understand how and when to take each.   amoxicillin -clavulanate 875-125 MG tablet Commonly known as: AUGMENTIN Take 1 tablet by mouth every 12 (twelve) hours for 5 days.   atorvastatin  10 MG tablet Commonly known as: LIPITOR Take 1 tablet (10 mg total) by mouth daily.   Blood Glucose Monitoring Suppl Devi 1 each by Does not apply route 3 (three) times daily. May dispense any manufacturer covered by patient's insurance.   CINNAMON PO Take 3 capsules by mouth 2 (two) times daily.   gabapentin 300 MG capsule Commonly known as: NEURONTIN Take 900 mg by mouth at bedtime.   GARLIC PO Take 2 capsules by mouth daily.   hydrochlorothiazide  25 MG tablet Commonly known as: HYDRODIURIL  Take 25 mg by mouth every other day.   HYDROcodone -acetaminophen  5-325 MG tablet Commonly known as: Norco Take 1 tablet by mouth every 6 (six) hours as needed for moderate pain. What changed:  how much to take when to take this reasons to take this   hydrOXYzine 25 MG tablet Commonly known as: ATARAX Take 25 mg by mouth at bedtime.   insulin  glargine-yfgn 100 UNIT/ML Pen Commonly known as: SEMGLEE Inject 15 Units into the skin daily. May substitute as needed per insurance.   insulin  lispro 100 UNIT/ML KwikPen Commonly known as: HUMALOG Inject 4 Units into the skin 3 (three) times  daily with meals. Only take if eating a meal AND Blood Glucose (BG) is 80 or higher.   Lancet Device Misc 1 each by Does not apply route 3 (three) times daily. May dispense any manufacturer covered by patient's insurance.   lisinopril  40 MG tablet Commonly known as: ZESTRIL  Take 40 mg by mouth daily.   metFORMIN  1000 MG tablet Commonly known as: GLUCOPHAGE  Take 1 tablet (1,000 mg total) by mouth 2 (two) times daily with a meal.   Microlet Lancets Misc Pt uses microlet lancets for his microlet next lancet device. What changed: Another medication with the same name was added. Make sure you understand how and when to take each.   Lancets Misc 1 each by Does not apply route 3 (three) times daily. Use as directed to check blood sugar. May dispense any manufacturer covered by patient's insurance and fits patient's device. What changed: You were already taking a medication with the  same name, and this prescription was added. Make sure you understand how and when to take each.   pantoprazole 20 MG tablet Commonly known as: PROTONIX Take 20 mg by mouth at bedtime.   Pen Needles 31G X 5 MM Misc 1 each by Does not apply route 3 (three) times daily. May dispense any manufacturer covered by patient's insurance.        Allergies  Allergen Reactions   Iodinated Contrast Media Itching and Other (See Comments)    Pruritis and a single hive following first time administration of IV contrast.  Benadryl administered with improvement of symptoms.     Procedures/Studies: DG Chest Port 1 View Result Date: 08/31/2023 CLINICAL DATA:  Status post bronchoscopy with biopsy on the right. EXAM: PORTABLE CHEST 1 VIEW COMPARISON:  02/18/2021. Chest, abdomen and pelvis CT dated 08/28/2023. FINDINGS: Poor inspiration. Grossly normal sized heart. Poorly defined patchy density in the medial superior aspect of the right upper lobe corresponding to the area of concern on the recent CT. No pneumothorax. Interval  mild atelectasis in the left mid and lower lung zones and right lower lung zone. Thoracolumbar spine degenerative changes. IMPRESSION: 1. No pneumothorax. 2. Poorly defined patchy density in the medial superior aspect of the right upper lobe corresponding to the area of concern on the recent CT. 3. Interval mild atelectasis in the left mid and lower lung zones and right lower lung zone. Electronically Signed   By: Catherin Closs M.D.   On: 08/31/2023 14:50   DG C-ARM BRONCHOSCOPY Result Date: 08/31/2023 C-ARM BRONCHOSCOPY: Fluoroscopy was utilized by the requesting physician.  No radiographic interpretation.   CT Head Wo Contrast Result Date: 08/28/2023 CLINICAL DATA:  Headache, new onset. EXAM: CT HEAD WITHOUT CONTRAST TECHNIQUE: Contiguous axial images were obtained from the base of the skull through the vertex without intravenous contrast. RADIATION DOSE REDUCTION: This exam was performed according to the departmental dose-optimization program which includes automated exposure control, adjustment of the mA and/or kV according to patient size and/or use of iterative reconstruction technique. COMPARISON:  None Available. FINDINGS: Brain: No acute intracranial hemorrhage. No CT evidence of acute infarct. Nonspecific hypoattenuation in the periventricular and subcortical white matter favored to reflect chronic microvascular ischemic changes. No edema, mass effect, or midline shift. The basilar cisterns are patent. Ventricles: The ventricles are normal. Vascular: Atherosclerotic calcifications of the carotid siphons. No hyperdense vessel. Skull: No acute or aggressive finding. Orbits: Orbits are symmetric. Sinuses: Mild mucosal thickening in the ethmoid sinuses. Other: Mastoid air cells are clear. IMPRESSION: No CT evidence of acute intracranial abnormality. Electronically Signed   By: Denny Flack M.D.   On: 08/28/2023 11:35   CT CHEST ABDOMEN PELVIS WO CONTRAST Result Date: 08/28/2023 CLINICAL DATA:  Sepsis,  fever, chills * Tracking Code: BO * EXAM: CT CHEST, ABDOMEN AND PELVIS WITHOUT CONTRAST TECHNIQUE: Multidetector CT imaging of the chest, abdomen and pelvis was performed following the standard protocol without IV contrast. RADIATION DOSE REDUCTION: This exam was performed according to the departmental dose-optimization program which includes automated exposure control, adjustment of the mA and/or kV according to patient size and/or use of iterative reconstruction technique. COMPARISON:  None Available. FINDINGS: CT CHEST FINDINGS Cardiovascular: No significant vascular findings. Normal heart size. Three-vessel coronary artery calcifications. Enlargement of the main pulmonary artery measuring up to 3.8 cm in caliber. And no pericardial effusion. Mediastinum/Nodes: No enlarged mediastinal, hilar, or axillary lymph nodes. Thyroid gland, trachea, and esophagus demonstrate no significant findings. Lungs/Pleura: Somewhat ill-defined heterogeneous and  ground-glass consolidation in the medial right upper lobe with a more dense, solid appearing component medially abutting the pleura, overall dimensions 4.4 x 4.0 cm (series 4, image 42), most solid medial component 2.7 x 2.0 cm (series 3, image 17). No pleural effusion or pneumothorax. Musculoskeletal: No chest wall abnormality. No acute osseous findings. CT ABDOMEN PELVIS FINDINGS Hepatobiliary: No solid liver abnormality is seen. Hepatic steatosis. Status post cholecystectomy. No biliary dilatation. Possible dropped gallstones in the hepatorenal recess (series 3, image 67). Pancreas: Unremarkable. No pancreatic ductal dilatation or surrounding inflammatory changes. Spleen: Normal in size without significant abnormality. Adrenals/Urinary Tract: Adrenal glands are unremarkable. Kidneys are normal, without renal calculi, solid lesion, or hydronephrosis. Bladder is unremarkable. Stomach/Bowel: Stomach is within normal limits. Appendix appears normal. No evidence of bowel wall  thickening, distention, or inflammatory changes. Vascular/Lymphatic: Aortic atherosclerosis. No enlarged abdominal or pelvic lymph nodes. Reproductive: No mass or other abnormality. Other: No abdominal wall hernia or abnormality. No ascites. Musculoskeletal: No acute osseous findings. IMPRESSION: 1. Somewhat ill-defined heterogeneous and ground-glass consolidation in the medial right upper lobe with a more dense, solid appearing component medially abutting the pleura, overall dimensions 4.4 x 4.0 cm, most solid medial component 2.7 x 2.0 cm. This is of uncertain nature, possibly infectious or inflammatory and possibly including a pulmonary abscess in the stated setting of intermittent fever and chills. Underlying solid mass not excluded. Contrast enhanced CT may be helpful for further assessment. 2. No noncontrast CT evidence of lymphadenopathy or metastatic disease in the chest, abdomen, or pelvis. 3. Enlargement of the main pulmonary artery, as can be seen in pulmonary hypertension. 4. Coronary artery disease. 5. Hepatic steatosis. 6. Status post cholecystectomy. Aortic Atherosclerosis (ICD10-I70.0). Electronically Signed   By: Fredricka Jenny M.D.   On: 08/28/2023 11:19       Discharge Exam: Vitals:   09/01/23 2043 09/02/23 0540  BP: (!) 170/78 (!) 177/82  Pulse: 65 (!) 51  Resp: 18 20  Temp: 99.1 F (37.3 C) 98.2 F (36.8 C)  SpO2: 97% 98%    General: Pt is alert, awake, not in acute distress Cardiovascular: RRR, S1/S2 +, no edema Respiratory: CTA bilaterally, no wheezing, no rhonchi, no respiratory distress, no conversational dyspnea, room air  Abdominal: Soft, NT, ND, bowel sounds + Extremities: no edema, no cyanosis Psych: Normal mood and affect, stable judgement and insight     The results of significant diagnostics from this hospitalization (including imaging, microbiology, ancillary and laboratory) are listed below for reference.     Microbiology: Recent Results (from the past  240 hours)  Culture, blood (Routine X 2) w Reflex to ID Panel     Status: None   Collection Time: 08/28/23  9:24 AM   Specimen: BLOOD  Result Value Ref Range Status   Specimen Description   Final    BLOOD RIGHT ANTECUBITAL Performed at [facility], [address] [place], [place] [address]    Special Requests   Final    BOTTLES DRAWN AEROBIC AND ANAEROBIC Blood Culture adequate volume Performed at [facility], [address], [place], [place] [address]    Culture   Final    NO GROWTH 5 DAYS Performed at [facility], [address] [place], [place] [address]    Report Status 09/02/2023 FINAL  Final  Culture, blood (Routine X 2) w Reflex to ID Panel     Status: None   Collection Time: 08/28/23  9:29 AM   Specimen: BLOOD RIGHT WRIST  Result Value Ref Range Status   Specimen Description   Final    BLOOD RIGHT WRIST Performed at [facility], [address], [place], [place] [address]    Special Requests   Final    BOTTLES DRAWN AEROBIC AND ANAEROBIC Blood Culture adequate volume Performed at [facility], [address] [place], [place] [address]    Culture   Final    NO GROWTH 5 DAYS Performed at [facility], [address] [place], [place] [address]    Report Status 09/02/2023 FINAL  Final  Resp panel by RT-PCR (RSV, Flu A&B, Covid) Anterior Nasal Swab     Status: None   Collection Time: 08/28/23  8:30 PM   Specimen: Anterior Nasal Swab  Result Value Ref Range Status   SARS Coronavirus 2 by RT PCR NEGATIVE NEGATIVE Final    Comment: (NOTE) SARS-CoV-2 target nucleic acids are NOT DETECTED.  The SARS-CoV-2 RNA is generally detectable in upper respiratory specimens during the acute phase of infection. The lowest concentration of SARS-CoV-2 viral copies this assay can detect is 138 copies/mL. A negative result does not preclude SARS-Cov-2 infection and should not be used as the sole basis  for treatment or other patient management decisions. A negative result may occur with  improper specimen collection/handling, submission of specimen other than nasopharyngeal swab, presence of viral mutation(s) within the areas targeted by this assay, and inadequate number of viral copies(<138 copies/mL). A negative result must be combined with clinical observations, patient history, and epidemiological information. The expected result is Negative.  Fact Sheet for Patients:  BloggerCourse.com  Fact Sheet for Healthcare Providers:  SeriousBroker.it  This test is no t yet approved or cleared by the [place]  FDA and  has been authorized for detection and/or diagnosis of SARS-CoV-2 by FDA under an Emergency Use Authorization (EUA). This EUA will remain  in effect (meaning this test can be used) for the duration of the COVID-19 declaration under Section 564(b)(1) of the Act, 21 U.S.C.section 360bbb-3(b)(1), unless the authorization is terminated  or revoked sooner.       Influenza A by PCR NEGATIVE NEGATIVE Final   Influenza B by PCR NEGATIVE NEGATIVE Final    Comment: (NOTE) The Xpert Xpress SARS-CoV-2/FLU/RSV plus assay is intended as an aid in the diagnosis of influenza from Nasopharyngeal swab specimens and should not be used as a sole basis for treatment. Nasal washings and aspirates are unacceptable for Xpert Xpress SARS-CoV-2/FLU/RSV testing.  Fact Sheet for Patients: BloggerCourse.com  Fact Sheet for Healthcare Providers: SeriousBroker.it  This test is not yet approved or cleared by the [place]  FDA and has been authorized for detection and/or diagnosis of SARS-CoV-2 by FDA under an Emergency Use Authorization (EUA). This EUA will remain in effect (meaning this test can be used) for the duration of the COVID-19 declaration under Section 564(b)(1) of the Act, 21  U.S.C. section 360bbb-3(b)(1), unless the authorization is terminated or revoked.     Resp Syncytial Virus by PCR NEGATIVE NEGATIVE Final    Comment: (NOTE) Fact Sheet for Patients: BloggerCourse.com  Fact Sheet for Healthcare Providers: SeriousBroker.it  This test is not yet approved or cleared by the [place]  FDA and has been authorized for detection and/or diagnosis of SARS-CoV-2 by FDA under an Emergency Use Authorization (EUA). This EUA will remain in effect (meaning this test can be used) for the duration of the COVID-19 declaration under Section 564(b)(1) of the Act, 21 U.S.C. section 360bbb-3(b)(1), unless the  authorization is terminated or revoked.  Performed at [facility], [address] [place], [place] [address]   Culture, BAL-quantitative w Gram Stain     Status: None (Preliminary result)   Collection Time: 08/31/23  1:06 PM   Specimen: Bronchial Alveolar Lavage; Respiratory  Result Value Ref Range Status   Specimen Description BRONCHIAL ALVEOLAR LAVAGE  Final   Special Requests RUL  Final   Gram Stain   Final    RARE WBC PRESENT, PREDOMINANTLY PMN NO ORGANISMS SEEN    Culture   Final    NO GROWTH < 24 HOURS Performed at [facility], [address] [place], [place] [address]    Report Status PENDING  Incomplete  Anaerobic culture w Gram Stain     Status: None (Preliminary result)   Collection Time: 08/31/23  1:06 PM   Specimen: Bronchoalveolar Lavage  Result Value Ref Range Status   Specimen Description BRONCHIAL ALVEOLAR LAVAGE  Final   Special Requests RUL  Final   Gram Stain   Final    RARE WBC PRESENT, PREDOMINANTLY PMN NO ORGANISMS SEEN Performed at [facility], [address], [place], [place] [address]    Culture PENDING  Incomplete   Report Status PENDING  Incomplete     Labs: BNP (last 3 results) No results for input(s): BNP in the last 8760  hours. Basic Metabolic Panel: Recent Labs  Lab 08/28/23 0933 08/30/23 0531  NA 137 140  K 4.7 4.0  CL 96* 104  CO2 26 24  GLUCOSE 288* 219*  BUN 11 10  CREATININE 1.07 1.05  CALCIUM  10.5* 8.5*   Liver Function Tests: Recent Labs  Lab 08/28/23 0933  AST 15  ALT 17  ALKPHOS 58  BILITOT 0.5  PROT 7.6  ALBUMIN 4.1   Recent Labs  Lab 08/28/23 0933  LIPASE 27   No results for input(s): AMMONIA in the last 168 hours. CBC: Recent Labs  Lab 08/28/23 0933 08/30/23 0531  WBC 9.5 8.4  NEUTROABS 6.8  --   HGB 13.2 12.1*  HCT 39.4 38.8*  MCV 90.6 96.0  PLT 214 219   Cardiac Enzymes: No results for input(s): CKTOTAL, CKMB, CKMBINDEX, TROPONINI in the last 168 hours. BNP: Invalid input(s): POCBNP CBG: Recent Labs  Lab 09/01/23 0731 09/01/23 1117 09/01/23 1656 09/01/23 2136 09/02/23 0732  GLUCAP 238* 272* 201* 218* 233*   D-Dimer No results for input(s): DDIMER in the last 72 hours. Hgb A1c No results for input(s): HGBA1C in the last 72 hours. Lipid Profile No results for input(s): CHOL, HDL, LDLCALC, TRIG, CHOLHDL, LDLDIRECT in the last 72 hours. Thyroid function studies No results for input(s): TSH, T4TOTAL, T3FREE, THYROIDAB in the last 72 hours.  Invalid input(s): FREET3 Anemia work up No results for input(s): VITAMINB12, FOLATE, FERRITIN, TIBC, IRON, RETICCTPCT in the last 72 hours. Urinalysis    Component Value Date/Time   COLORURINE YELLOW 08/28/2023 0933   APPEARANCEUR CLEAR 08/28/2023 0933   LABSPEC 1.033 (H) 08/28/2023 0933   PHURINE 6.0 08/28/2023 0933   GLUCOSEU >1,000 (A) 08/28/2023 0933   HGBUR NEGATIVE 08/28/2023 0933   BILIRUBINUR NEGATIVE 08/28/2023 0933   BILIRUBINUR n 02/04/2016 1152   KETONESUR 40 (A) 08/28/2023 0933   PROTEINUR NEGATIVE 08/28/2023 0933   UROBILINOGEN negative 02/04/2016 1152   NITRITE NEGATIVE 08/28/2023 0933   LEUKOCYTESUR NEGATIVE 08/28/2023 0933   Sepsis  Labs Recent Labs  Lab 08/28/23 0933 08/30/23 0531  WBC 9.5 8.4   Microbiology  Recent Results (from the past 240 hours)  Culture, blood (Routine X 2) w Reflex to ID Panel     Status: None   Collection Time: 08/28/23  9:24 AM   Specimen: BLOOD  Result Value Ref Range Status   Specimen Description   Final    BLOOD RIGHT ANTECUBITAL Performed at [facility], [address] [place], [place] [address]    Special Requests   Final    BOTTLES DRAWN AEROBIC AND ANAEROBIC Blood Culture adequate volume Performed at [facility], [address] [place], [place] [address]    Culture   Final    NO GROWTH 5 DAYS Performed at [facility], [address], [place], [place] [address]    Report Status 09/02/2023 FINAL  Final  Culture, blood (Routine X 2) w Reflex to ID Panel     Status: None   Collection Time: 08/28/23  9:29 AM   Specimen: BLOOD RIGHT WRIST  Result Value Ref Range Status   Specimen Description   Final    BLOOD RIGHT WRIST Performed at [facility], [address] [place], [place] [address]    Special Requests   Final    BOTTLES DRAWN AEROBIC AND ANAEROBIC Blood Culture adequate volume Performed at [facility], [address], [place], [place] [address]    Culture   Final    NO GROWTH 5 DAYS Performed at [facility], [address] [place], [place] [address]    Report Status 09/02/2023 FINAL  Final  Resp panel by RT-PCR (RSV, Flu A&B, Covid) Anterior Nasal Swab     Status: None   Collection Time: 08/28/23  8:30 PM   Specimen: Anterior Nasal Swab  Result Value Ref Range Status   SARS Coronavirus 2 by RT PCR NEGATIVE NEGATIVE Final    Comment: (NOTE) SARS-CoV-2 target nucleic acids are NOT DETECTED.  The SARS-CoV-2 RNA is generally detectable in upper respiratory specimens during the acute phase of infection. The lowest concentration of SARS-CoV-2 viral copies this assay can detect  is 138 copies/mL. A negative result does not preclude SARS-Cov-2 infection and should not be used as the sole basis for treatment or other patient management decisions. A negative result may occur with  improper specimen collection/handling, submission of specimen other than nasopharyngeal swab, presence of viral mutation(s) within the areas targeted by this assay, and inadequate number of viral copies(<138 copies/mL). A negative result must be combined with clinical observations, patient history, and epidemiological information. The expected result is Negative.  Fact Sheet for Patients:  BloggerCourse.com  Fact Sheet for Healthcare Providers:  SeriousBroker.it  This test is no t yet approved or cleared by the [place]  FDA and  has been authorized for detection and/or diagnosis of SARS-CoV-2 by FDA under an Emergency Use Authorization (EUA). This EUA will remain  in effect (meaning this test can be used) for the duration of the COVID-19 declaration under Section 564(b)(1) of the Act, 21 U.S.C.section 360bbb-3(b)(1), unless the authorization is terminated  or revoked sooner.       Influenza A by PCR NEGATIVE NEGATIVE Final   Influenza B by PCR NEGATIVE NEGATIVE Final    Comment: (NOTE) The Xpert Xpress SARS-CoV-2/FLU/RSV plus assay is intended as an aid in the diagnosis of influenza from Nasopharyngeal swab specimens and should not be used as a sole basis for treatment. Nasal washings and aspirates are unacceptable for Xpert Xpress SARS-CoV-2/FLU/RSV testing.  Fact Sheet for Patients: BloggerCourse.com  Fact Sheet for Healthcare Providers: SeriousBroker.it  This test is not yet approved or cleared by the [place]  FDA and has been authorized for detection and/or diagnosis of SARS-CoV-2 by FDA under an Emergency Use Authorization (EUA). This EUA will remain in effect  (meaning this test can be used) for the duration of the COVID-19 declaration under Section 564(b)(1) of the Act, 21 U.S.C. section 360bbb-3(b)(1), unless the authorization is terminated or revoked.     Resp Syncytial Virus by PCR NEGATIVE NEGATIVE Final    Comment: (NOTE) Fact Sheet for Patients: BloggerCourse.com  Fact Sheet for Healthcare Providers: SeriousBroker.it  This test is not yet approved or cleared by the [place]  FDA and has been authorized for detection and/or diagnosis of SARS-CoV-2 by FDA under an Emergency Use Authorization (EUA). This EUA will remain in effect (meaning this test can be used) for the duration of the COVID-19 declaration under Section 564(b)(1) of the Act, 21 U.S.C. section 360bbb-3(b)(1), unless the authorization is terminated or revoked.  Performed at [facility], [address], [place], [place] [address]   Culture, BAL-quantitative w Gram Stain     Status: None (Preliminary result)   Collection Time: 08/31/23  1:06 PM   Specimen: Bronchial Alveolar Lavage; Respiratory  Result Value Ref Range Status   Specimen Description BRONCHIAL ALVEOLAR LAVAGE  Final   Special Requests RUL  Final   Gram Stain   Final    RARE WBC PRESENT, PREDOMINANTLY PMN NO ORGANISMS SEEN    Culture   Final    NO GROWTH < 24 HOURS Performed at [facility], [address] [place], [place] [address]    Report Status PENDING  Incomplete  Anaerobic culture w Gram Stain     Status: None (Preliminary result)   Collection Time: 08/31/23  1:06 PM   Specimen: Bronchoalveolar Lavage  Result Value Ref Range Status   Specimen Description BRONCHIAL ALVEOLAR LAVAGE  Final   Special Requests RUL  Final   Gram Stain   Final    RARE WBC PRESENT, PREDOMINANTLY PMN NO ORGANISMS SEEN Performed at [facility], [address] [place], [place] [address]    Culture PENDING  Incomplete   Report  Status PENDING  Incomplete     Patient was seen and examined on the day of discharge and was found to be in stable condition. Time coordinating discharge: 35 minutes including assessment and coordination of care, as well as examination of the patient.   SIGNED:  Daren Eck, DO [facility] 09/02/2023, 9:00 AM

## 2023-09-02 NOTE — Plan of Care (Signed)

## 2023-09-02 NOTE — TOC Transition Note (Signed)
 Transition of Care [facility]) - Discharge Note   Patient Details  Name: [name] MRN: [id number] Date of Birth: [birth year]  [facility] CM/SW Contact:  Ruben Corolla, RN Phone Number: 09/02/2023, 9:01 AM   Clinical Narrative: d/c home no CM needs.      Final next level of care: Home/Self Care Barriers to Discharge: No Barriers Identified   Patient Goals and CMS Choice Patient states their goals for this hospitalization and ongoing recovery are:: Home CMS Medicare.gov Compare Post Acute Care list provided to:: Patient Choice offered to / list presented to : Patient      Discharge Placement                       Discharge Plan and Services Additional resources added to the After Visit Summary for                                       Social Drivers of Health (SDOH) Interventions SDOH Screenings   Food Insecurity: No Food Insecurity (08/28/2023)  Housing: Low Risk  (08/28/2023)  Transportation Needs: No Transportation Needs (08/28/2023)  Utilities: Not At Risk (08/28/2023)  Depression (PHQ2-9): Low Risk  (04/08/2019)  Financial Resource Strain: Medium Risk (04/21/2022)   Received from [facility]  Physical Activity: Insufficiently Active (04/21/2022)   Received from [facility]  Social Connections: Socially Integrated (08/28/2023)  Stress: No Stress Concern Present (02/13/2023)   Received from [facility]  Tobacco Use: High Risk (08/31/2023)  Health Literacy: Inadequate Health Literacy (02/13/2023)   Received from [facility]     Readmission Risk Interventions    08/31/2023   10:37 AM  Readmission Risk Prevention Plan  Post Dischage Appt Complete  Medication Screening Complete  Transportation Screening Complete

## 2023-09-03 LAB — CULTURE, BAL-QUANTITATIVE W GRAM STAIN: Culture: NO GROWTH

## 2023-09-03 LAB — ANAEROBIC CULTURE W GRAM STAIN

## 2023-09-22 ENCOUNTER — Telehealth: Payer: Self-pay

## 2023-09-22 NOTE — Telephone Encounter (Signed)
 Copied from CRM [id number]. Topic: Clinical - Lab/Test Results >> Sep 22, 2023  8:08 AM Nathanel DEL wrote: Reason for CRM: Niece Renata ([place]) calling b/c her uncle, the pt, would like someone from pulmonary to call him w/ the results of his bronchoscopy done by Dr Shelah. She said she was in the room when his pcp gave him the results.  He is not a pt here.  But he told her he wanted someone w/ expertise to tell him everything is OK w/ him.  Spoke with patient regarding prior message . Patient had a procedure on 08/31/2023 with Dr.Byrum . Patient has never been seen in our office. Advised patient that Dr.Byrum is out of the office this week and once he returns our office will send him this message. Patient stated he wanted Dr.Byrum to give him the Bronch result's and can explain result's to him . Patient is ok with waiting for result's.  Dr.Byrum can you please advise .  Thank you

## 2023-09-23 NOTE — Telephone Encounter (Signed)
 Please let them know that his bronchoscopy showed atypical cells but no findings definitive for cancer.  There is no evidence to date for any specific infection.  He needs to be seen in our office as a hospital follow-up visit, any provider.

## 2023-09-23 NOTE — Telephone Encounter (Signed)
 Called and spoke with [name]. Informed pt of RB note regarding Bronch results. Pt verbalized understanding and has been scheduled HFU office visit. Nothing further needed.

## 2023-09-28 ENCOUNTER — Other Ambulatory Visit (INDEPENDENT_AMBULATORY_CARE_PROVIDER_SITE_OTHER): Payer: Self-pay

## 2023-09-28 ENCOUNTER — Other Ambulatory Visit (HOSPITAL_COMMUNITY): Payer: Self-pay

## 2023-09-28 ENCOUNTER — Encounter: Payer: Self-pay | Admitting: Physical Medicine and Rehabilitation

## 2023-09-28 ENCOUNTER — Ambulatory Visit: Admitting: Physical Medicine and Rehabilitation

## 2023-09-28 DIAGNOSIS — M5416 Radiculopathy, lumbar region: Secondary | ICD-10-CM | POA: Diagnosis not present

## 2023-09-28 DIAGNOSIS — M5441 Lumbago with sciatica, right side: Secondary | ICD-10-CM

## 2023-09-28 DIAGNOSIS — M5442 Lumbago with sciatica, left side: Secondary | ICD-10-CM

## 2023-09-28 DIAGNOSIS — G894 Chronic pain syndrome: Secondary | ICD-10-CM | POA: Diagnosis not present

## 2023-09-28 DIAGNOSIS — G8929 Other chronic pain: Secondary | ICD-10-CM

## 2023-09-28 NOTE — Progress Notes (Unsigned)
 [name] - 68 y.o. male MRN [id number]  Date of birth: October 13, 1955  Office Visit Note: Visit Date: 09/28/2023 PCP: [facility] [address] Referred by: [facility] [address]  Subjective: Chief Complaint  Patient presents with   Lower Back - Pain   HPI: [name] is a 68 y.o. male who comes in today as a self referral for evaluation of chronic, worsening and severe bilateral lower back pain radiating to buttocks and lateral hips, left greater than right. Pain ongoing for several years. His pain worsens with movement and activity. He describes pain as sore and burning, currently rates as 9 out of 10. Some relief of pain with home exercise regimen, rest and use of medications. He is currently treated for chronic pain by Mady Kirsch, NP at [facility], he is prescribed Percocet. Lumbar MRI imaging from 2020 shows multi level spondylosis, most notable at L5-S1 with a posterior central annular fissure tiny focal central disc protrusion contacting the bilateral descending S1 nerve roots. There is also bilateral foraminal narrowing at this level. No high grade spinal canal stenosis. He was previously treated at [facility]. He underwent both lumbar epidural steroid injections and lumbar facet joint injections with minimal relief of pain. States injections caused burning to bilateral hip regions. Patient denies focal weakness, numbness and tingling. No recent trauma or falls.       Review of Systems  Musculoskeletal:  Positive for back pain.  Neurological:  Negative for tingling, sensory change, focal weakness and weakness.  All other systems reviewed and are negative.  Otherwise per HPI.  Assessment & Plan: Visit Diagnoses:    ICD-10-CM   1. Chronic bilateral low back pain with bilateral sciatica  M54.42    M54.41    G89.29     2. Lumbar radiculopathy  M54.16 XR Lumbar Spine 2-3 Views    3. Chronic pain syndrome  G89.4        Plan: Findings:   Chronic, worsening and severe bilateral lower back pain radiating to buttocks and lateral hips, left greater than right. Patient continues to have severe pain despite good conservative therapies such as home exercise regimen, rest and use of medications. Patients clinical presentation and exam are consistent with lumbar radiculopathy, more of L5 and S1 nerve pattern. I obtained lumbar radiographs in the office today that show grade 1 anterolisthesis of L4 on L5. There is disc height loss, foraminal stenosis and facet arthropathy to lower lumbar spine. We discussed treatment plan in detail today. Next step is to obtain new lumbar MRI imaging. Depending on results of lumbar MRI imaging we discussed possibility of performing lumbar injections. Patient has no questions at this time. He can continue chronic pain management with [facility]. No red flag symptoms noted upon exam today.     Meds & Orders: No orders of the defined types were placed in this encounter.   Orders Placed This Encounter  Procedures   XR Lumbar Spine 2-3 Views    Follow-up: Return for Lumbar MRI review.   Procedures: No procedures performed      Clinical History: No specialty comments available.   He reports that he has been smoking cigarettes. He has a 30 pack-year smoking history. He has never used smokeless tobacco.  Recent Labs    08/29/23 0526 08/30/23 0531  HGBA1C 11.5* 11.1*    Objective:  VS:  HT:    WT:   BMI:     BP:   HR: bpm  TEMP: ( )  RESP:  Physical Exam Vitals and nursing note reviewed.  HENT:     Head: Normocephalic and atraumatic.     Right Ear: External ear normal.     Left Ear: External ear normal.     Nose: Nose normal.     Mouth/Throat:     Mouth: Mucous membranes are moist.  Eyes:     Extraocular Movements: Extraocular movements intact.  Cardiovascular:     Rate and Rhythm: Normal rate.     Pulses: Normal pulses.  Pulmonary:     Effort: Pulmonary effort is normal.   Abdominal:     General: Abdomen is flat. There is no distension.  Musculoskeletal:        General: Tenderness present.     Cervical back: Normal range of motion.     Comments: Patient rises from seated position to standing without difficulty. Good lumbar range of motion. No pain noted with facet loading. 5/5 strength noted with bilateral hip flexion, knee flexion/extension, ankle dorsiflexion/plantarflexion and EHL. No clonus noted bilaterally. No pain upon palpation of greater trochanters. No pain with internal/external rotation of bilateral hips. Sensation intact bilaterally. Dysesthesias noted to bilateral L5 and S1 dermatomes. Negative slump test bilaterally. Ambulates without aid, gait steady.     Skin:    General: Skin is warm and dry.     Capillary Refill: Capillary refill takes less than 2 seconds.  Neurological:     General: No focal deficit present.     Mental Status: He is alert and oriented to person, place, and time.  Psychiatric:        Mood and Affect: Mood normal.        Behavior: Behavior normal.     Ortho Exam  Imaging: XR Lumbar Spine 2-3 Views Result Date: 09/28/2023 AP and lateral radiographs of lumbar spine show normal segmentation, grade 1 anterolisthesis of L4 on L5, there is disc height loss and biforaminal narrowing at L4-L5 and L5-S1. Lower lumbar facet arthropathy noted. No fractures or dislocations.    Past Medical/Family/Surgical/Social History: Medications & Allergies reviewed per EMR, new medications updated. Patient Active Problem List   Diagnosis Date Noted   DM (diabetes mellitus), type 2 (HCC) 09/02/2023   Mass of upper lobe of right lung 08/31/2023   Fever 08/29/2023   Tinnitus of left ear 10/03/2015   History of amputation of lesser toe (HCC) 10/03/2015   Decreased hearing of left ear 10/03/2015   TM (tympanic membrane disorder) 10/03/2015   Essential hypertension 01/24/2015   Smoker 01/24/2015   Alcohol abuse, daily use 01/24/2015   Anal  fistula 03/07/2013   Past Medical History:  Diagnosis Date   Arthritis    Blood transfusion without reported diagnosis    30 yrs ago   Chronic low back pain    Diabetes (HCC)    Hypertension    Insomnia    Muscle cramps    Skin fissure    Uncontrolled type 2 diabetes mellitus without complication, without long-term current use of insulin  01/24/2015   Family History  Problem Relation Age of Onset   Seizures Father 38       broken hip   Diabetes Sister    Hypertension Sister    Diabetes Sister    Diabetes Sister    Colon cancer Neg Hx    Past Surgical History:  Procedure Laterality Date   CHOLECYSTECTOMY     EYE MUSCLE SURGERY     bilateral   FINGER SURGERY  right hand   TOE AMPUTATION     left   VIDEO BRONCHOSCOPY WITH ENDOBRONCHIAL NAVIGATION Right 08/31/2023   Procedure: VIDEO BRONCHOSCOPY WITH ENDOBRONCHIAL NAVIGATION;  Surgeon: Shelah Lamar RAMAN, MD;  Location[facility];  Service: Pulmonary;  Laterality: Right;   Social History   Occupational History   Occupation: truck Sports administrator: [employer]  Tobacco Use   Smoking status: Every Day    Current packs/day: 1.00    Average packs/day: 1 pack/day for 30.0 years (30.0 ttl pk-yrs)    Types: Cigarettes   Smokeless tobacco: Never  Substance and Sexual Activity   Alcohol use: Yes    Comment: 2 24oz cans of beer twice a day   Drug use: No   Sexual activity: Not on file

## 2023-09-28 NOTE — Progress Notes (Unsigned)
 Pain Scale   Average Pain 4 Patient advising he has lower back pain radiating to bilateral hips. Patient advising his pain increases when standing and decreases when sitting.        +Driver, -BT, -Dye Allergies.

## 2023-09-29 ENCOUNTER — Encounter: Payer: Self-pay | Admitting: Sports Medicine

## 2023-10-02 LAB — FUNGUS CULTURE WITH STAIN

## 2023-10-02 LAB — FUNGUS CULTURE RESULT

## 2023-10-02 LAB — FUNGAL ORGANISM REFLEX

## 2023-10-06 ENCOUNTER — Encounter: Payer: Self-pay | Admitting: Pulmonary Disease

## 2023-10-06 ENCOUNTER — Ambulatory Visit: Admitting: Pulmonary Disease

## 2023-10-06 ENCOUNTER — Ambulatory Visit
Admission: RE | Admit: 2023-10-06 | Discharge: 2023-10-06 | Disposition: A | Discharge status: Home / Self Care | Source: Ambulatory Visit | Attending: Physical Medicine and Rehabilitation | Admitting: Physical Medicine and Rehabilitation

## 2023-10-06 VITALS — BP 96/62 | HR 71 | Ht 75.0 in | Wt 227.8 lb

## 2023-10-06 DIAGNOSIS — R918 Other nonspecific abnormal finding of lung field: Secondary | ICD-10-CM | POA: Diagnosis not present

## 2023-10-06 DIAGNOSIS — G894 Chronic pain syndrome: Secondary | ICD-10-CM

## 2023-10-06 DIAGNOSIS — M5416 Radiculopathy, lumbar region: Secondary | ICD-10-CM

## 2023-10-06 DIAGNOSIS — G8929 Other chronic pain: Secondary | ICD-10-CM

## 2023-10-06 NOTE — Patient Instructions (Signed)
 Will repeat your CT scan in about 4 weeks  I will see you back in about 6 weeks  Call us  with significant concerns

## 2023-10-06 NOTE — Progress Notes (Signed)
 [name]    [id number]    July 14, 1955  Primary Care Physician:[facility], [facility]  Referring Physician: [facility], [facility] [address] [place],  [place] [address]  Chief complaint:   In for evaluation, recent hospital follow-up  HPI:  Recently hospitalized for fevers, cough, headaches  Evaluation in the hospital did reveal the CT with a right upper lobe infiltrative process/mass lesion Post bronchoscopy - Cytology negative -Bronchoalveolar lavage negative  Has been doing really well No significant coughing, no significant shortness of breath with activities  Background history of hypertension, diabetes  Was treated for pneumonia, completed 10-day course of Augmentin  as outpatient  30-pack-year smoking history  No pertinent occupational history  Outpatient Encounter Medications as of 10/06/2023  Medication Sig   ACCU-CHEK GUIDE TEST test strip SMARTSIG:Strip(s)   atorvastatin  (LIPITOR) 10 MG tablet Take 1 tablet (10 mg total) by mouth daily.   Blood Glucose Monitoring Suppl (BLOOD GLUCOSE MONITOR SYSTEM) w/Device KIT Use to check blood sugar three times daily   CINNAMON PO Take 3 capsules by mouth 2 (two) times daily.   gabapentin (NEURONTIN) 300 MG capsule Take 900 mg by mouth at bedtime.   GARLIC PO Take 2 capsules by mouth daily.   Glucose Blood (BLOOD GLUCOSE TEST STRIPS) STRP Use to check blood sugar three times daily as directed   hydrochlorothiazide  (HYDRODIURIL ) 25 MG tablet Take 25 mg by mouth every other day.   hydrOXYzine (ATARAX) 25 MG tablet Take 25 mg by mouth at bedtime.   insulin  aspart (NOVOLOG ) 100 UNIT/ML FlexPen Inject 4 Units into the skin 3 (three) times daily with meals. Only take if eating a meal AND Blood Glucose (BG) is 80 or higher.   insulin  glargine (LANTUS ) 100 UNIT/ML Solostar Pen Inject 15 Units into the skin daily.   Insulin  Pen Needle (PEN NEEDLES) 31G X 5 MM MISC Use to inject insulin  three times daily   Lancet Device  MISC 1 each by Does not apply route 3 (three) times daily. May dispense any manufacturer covered by patient's insurance.   Lancets MISC Use to inject insulin  three times daily   lisinopril  (ZESTRIL ) 40 MG tablet Take 40 mg by mouth daily.   metFORMIN  (GLUCOPHAGE ) 1000 MG tablet Take 1 tablet (1,000 mg total) by mouth 2 (two) times daily with a meal.   MICROLET LANCETS MISC Pt uses microlet lancets for his microlet next lancet device.   oxyCODONE -acetaminophen  (PERCOCET) 10-325 MG tablet Take 1 tablet by mouth 4 (four) times daily as needed.   pantoprazole (PROTONIX) 20 MG tablet Take 20 mg by mouth at bedtime.   HYDROcodone -acetaminophen  (NORCO) 5-325 MG tablet Take 1 tablet by mouth every 6 (six) hours as needed for moderate pain. (Patient not taking: Reported on 10/06/2023)   No facility-administered encounter medications on file as of 10/06/2023.    Allergies as of 10/06/2023 - Review Complete 10/06/2023  Allergen Reaction Noted   Iodinated contrast media Itching and Other (See Comments) 05/23/2019    Past Medical History:  Diagnosis Date   Arthritis    Blood transfusion without reported diagnosis    30 yrs ago   Chronic low back pain    Diabetes (HCC)    Hypertension    Insomnia    Muscle cramps    Skin fissure    Uncontrolled type 2 diabetes mellitus without complication, without long-term current use of insulin  01/24/2015    Past Surgical History:  Procedure Laterality Date   CHOLECYSTECTOMY  EYE MUSCLE SURGERY     bilateral   FINGER SURGERY     right hand   TOE AMPUTATION     left   VIDEO BRONCHOSCOPY WITH ENDOBRONCHIAL NAVIGATION Right 08/31/2023   Procedure: VIDEO BRONCHOSCOPY WITH ENDOBRONCHIAL NAVIGATION;  Surgeon: Shelah Lamar RAMAN, MD;  Location: [facility] END[facility]COPY;  Service: Pulmonary;  Laterality: Right;    Family History  Problem Relation Age of Onset   Seizures Father 66       broken hip   Diabetes Sister    Hypertension Sister    Diabetes Sister    Diabetes  Sister    Colon cancer Neg Hx     Social History   Socioeconomic History   Marital status: Married    Spouse name: Not on file   Number of children: 3   Years of education: Not on file   Highest education level: Not on file  Occupational History   Occupation: truck Sports administrator: [employer]  Tobacco Use   Smoking status: Former    Current packs/day: 1.00    Average packs/day: 1 pack/day for 30.0 years (30.0 ttl pk-yrs)    Types: Cigarettes   Smokeless tobacco: Never  Substance and Sexual Activity   Alcohol use: Yes    Comment: 2 24oz cans of beer twice a day   Drug use: No   Sexual activity: Not on file  Other Topics Concern   Not on file  Social History Narrative   Not on file   Social Drivers of Health   Financial Resource Strain: Medium Risk (04/21/2022)   Received from [facility]   Overall Financial Resource Strain (CARDIA)    Difficulty of Paying Living Expenses: Somewhat hard  Food Insecurity: No Food Insecurity (08/28/2023)   Hunger Vital Sign    Worried About Running Out of Food in the Last Year: Never true    Ran Out of Food in the Last Year: Never true  Transportation Needs: No Transportation Needs (08/28/2023)   PRAPARE - Administrator, Civil Service (Medical): No    Lack of Transportation (Non-Medical): No  Physical Activity: Insufficiently Active (04/21/2022)   Received from [facility]   Exercise Vital Sign    On average, how many days per week do you engage in moderate to strenuous exercise (like a brisk walk)?: 7 days    On average, how many minutes do you engage in exercise at this level?: 10 min  Stress: No Stress Concern Present (02/13/2023)   Received from [facility] of Occupational Health - Occupational Stress Questionnaire    Feeling of Stress : Only a little  Social Connections: Socially Integrated (08/28/2023)   Social Connection and Isolation Panel     Frequency of Communication with Friends and Family: More than three times a week    Frequency of Social Gatherings with Friends and Family: Three times a week    Attends Religious Services: 1 to 4 times per year    Active Member of Clubs or Organizations: No    Attends Banker Meetings: 1 to 4 times per year    Marital Status: Married  Catering manager Violence: Not At Risk (08/28/2023)   Humiliation, Afraid, Rape, and Kick questionnaire    Fear of Current or Ex-Partner: No    Emotionally Abused: No    Physically Abused: No    Sexually Abused: No    Review of Systems  Respiratory:  Negative for cough and shortness of breath.     Vitals:   10/06/23 1012 10/06/23 1014  BP:  96/62  Pulse: 71 71  SpO2: 96%      Physical Exam Constitutional:      Appearance: Normal appearance.  HENT:     Head: Normocephalic.     Nose: Nose normal.     Mouth/Throat:     Mouth: Mucous membranes are moist.  Eyes:     General: No scleral icterus. Cardiovascular:     Rate and Rhythm: Normal rate and regular rhythm.     Heart sounds: No murmur heard.    No friction rub.  Pulmonary:     Effort: No respiratory distress.     Breath sounds: No stridor. No wheezing or rhonchi.  Musculoskeletal:     Cervical back: No rigidity.  Neurological:     Mental Status: He is alert.  Psychiatric:        Mood and Affect: Mood normal.      Data Reviewed: Recent CT scan of the chest reviewed showing 4 x 4 right upper lobe consolidation/infiltrate  Bronchoscopic documentation reviewed Bronchoscopy results reviewed with the patient  Assessment:  Likely multilobar pneumonia  History of smoking  Diabetes Hypertension  Symptoms are improving,  Plan/Recommendations: No changes to ongoing care  No indication for bronchodilators   Smoking cessation counseling  Will repeat CT scan in about 4 to 5 weeks to follow infiltrate resolution  Risk with smoking discussed  Follow-up in  about 6 weeks   Jennet Epley MD  Pulmonary and Critical Care 10/06/2023, 10:41 AM  CC: [facility], [address]

## 2023-10-07 ENCOUNTER — Other Ambulatory Visit (HOSPITAL_COMMUNITY): Payer: Self-pay

## 2023-10-13 LAB — ACID FAST CULTURE WITH REFLEXED SENSITIVITIES (MYCOBACTERIA): Acid Fast Culture: NEGATIVE

## 2023-10-22 ENCOUNTER — Emergency Department (HOSPITAL_BASED_OUTPATIENT_CLINIC_OR_DEPARTMENT_OTHER)
Admission: EM | Admit: 2023-10-22 | Discharge: 2023-10-22 | Disposition: A | Discharge status: Home / Self Care | Attending: Emergency Medicine | Admitting: Emergency Medicine

## 2023-10-22 ENCOUNTER — Other Ambulatory Visit: Payer: Self-pay

## 2023-10-22 ENCOUNTER — Emergency Department (HOSPITAL_BASED_OUTPATIENT_CLINIC_OR_DEPARTMENT_OTHER): Admitting: Radiology

## 2023-10-22 ENCOUNTER — Encounter (HOSPITAL_BASED_OUTPATIENT_CLINIC_OR_DEPARTMENT_OTHER): Payer: Self-pay

## 2023-10-22 DIAGNOSIS — Z79899 Other long term (current) drug therapy: Secondary | ICD-10-CM | POA: Diagnosis not present

## 2023-10-22 DIAGNOSIS — E119 Type 2 diabetes mellitus without complications: Secondary | ICD-10-CM | POA: Insufficient documentation

## 2023-10-22 DIAGNOSIS — Z794 Long term (current) use of insulin: Secondary | ICD-10-CM | POA: Diagnosis not present

## 2023-10-22 DIAGNOSIS — Y92410 Unspecified street and highway as the place of occurrence of the external cause: Secondary | ICD-10-CM | POA: Diagnosis not present

## 2023-10-22 DIAGNOSIS — Z7984 Long term (current) use of oral hypoglycemic drugs: Secondary | ICD-10-CM | POA: Insufficient documentation

## 2023-10-22 DIAGNOSIS — I10 Essential (primary) hypertension: Secondary | ICD-10-CM | POA: Diagnosis not present

## 2023-10-22 DIAGNOSIS — M545 Low back pain, unspecified: Secondary | ICD-10-CM | POA: Insufficient documentation

## 2023-10-22 MED ORDER — KETOROLAC TROMETHAMINE 15 MG/ML IJ SOLN
15.0000 mg | Freq: Once | INTRAMUSCULAR | Status: AC
  Administered 2023-10-22: 15 mg via INTRAMUSCULAR
  Filled 2023-10-22: qty 1

## 2023-10-22 MED ORDER — METHOCARBAMOL 500 MG PO TABS: 500.0000 mg | ORAL_TABLET | Freq: Two times a day (BID) | ORAL | 0 refills | Status: AC
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
## 2023-10-23 ENCOUNTER — Ambulatory Visit: Admitting: Physical Medicine and Rehabilitation

## 2023-10-23 ENCOUNTER — Encounter: Payer: Self-pay | Admitting: Physical Medicine and Rehabilitation

## 2023-10-23 DIAGNOSIS — G894 Chronic pain syndrome: Secondary | ICD-10-CM

## 2023-10-23 DIAGNOSIS — M5441 Lumbago with sciatica, right side: Secondary | ICD-10-CM | POA: Diagnosis not present

## 2023-10-23 DIAGNOSIS — G8929 Other chronic pain: Secondary | ICD-10-CM

## 2023-10-23 DIAGNOSIS — M5442 Lumbago with sciatica, left side: Secondary | ICD-10-CM

## 2023-10-23 DIAGNOSIS — M5416 Radiculopathy, lumbar region: Secondary | ICD-10-CM | POA: Diagnosis not present

## 2023-10-23 NOTE — Progress Notes (Signed)
 Pain Scale   Average Pain 8 Patient advising he has lower back pain that increases when standing and sitting.Patient advising when he sits pain decreases. Patient here for MRI review        +Driver, -BT, -Dye Allergies.
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
## 2023-11-06 ENCOUNTER — Ambulatory Visit
Admission: RE | Admit: 2023-11-06 | Discharge: 2023-11-06 | Disposition: A | Discharge status: Home / Self Care | Source: Ambulatory Visit | Attending: Pulmonary Disease | Admitting: Pulmonary Disease

## 2023-11-06 DIAGNOSIS — R918 Other nonspecific abnormal finding of lung field: Secondary | ICD-10-CM

## 2023-11-10 ENCOUNTER — Other Ambulatory Visit (HOSPITAL_COMMUNITY): Payer: Self-pay

## 2023-11-10 MED ORDER — LANTUS SOLOSTAR 100 UNIT/ML ~~LOC~~ SOPN
15.0000 [IU] | PEN_INJECTOR | Freq: Every day | SUBCUTANEOUS | 3 refills | Status: AC
  Filled 2023-11-10: qty 15, 100d supply, fill #0

## 2023-11-12 ENCOUNTER — Other Ambulatory Visit (HOSPITAL_COMMUNITY): Payer: Self-pay

## 2023-11-12 MED ORDER — PEN NEEDLES 31G X 5 MM MISC
1.0000 | Freq: Three times a day (TID) | 0 refills | Status: AC
  Filled 2023-11-12: qty 100, 30d supply, fill #0

## 2023-11-19 ENCOUNTER — Ambulatory Visit: Admitting: Physical Medicine and Rehabilitation

## 2023-11-19 ENCOUNTER — Other Ambulatory Visit: Payer: Self-pay

## 2023-11-19 VITALS — BP 170/88 | HR 84

## 2023-11-19 DIAGNOSIS — M5416 Radiculopathy, lumbar region: Secondary | ICD-10-CM

## 2023-11-19 MED ORDER — METHYLPREDNISOLONE ACETATE 40 MG/ML IJ SUSP
20.0000 mg | Freq: Once | INTRAMUSCULAR | Status: AC
  Administered 2023-11-19: 20 mg

## 2023-11-19 NOTE — Progress Notes (Signed)
 Pain Scale   Average Pain 4 Patient advising he has chronic lower back pain radiating to right hip pain increases when standing and walking, and decreases when sitting and resting. Patient advised to F/U with PCP due to High Blood pressure reading today.        +Driver, -BT, -Dye Allergies.

## 2023-11-19 NOTE — Progress Notes (Signed)
 [name] - 68 y.o. male MRN [id number]  Date of birth: Mar 13, 1956  Office Visit Note: Visit Date: 11/19/2023 PCP: [facility], [address] Referred by: [facility], [address]  Subjective: Chief Complaint  Patient presents with   Lower Back - Pain   HPI:  [name] is a 68 y.o. male who comes in today at the request of Duwaine Pouch, FNP for planned Left L5-S1 Lumbar Interlaminar epidural steroid injection with fluoroscopic guidance.  The patient has failed conservative care including home exercise, medications, time and activity modification.  This injection will be diagnostic and hopefully therapeutic.  Please see requesting physician notes for further details and justification. HbA1c is 11. Should still be ok to do injection. He recently been on insulin .   ROS Otherwise per HPI.  Assessment & Plan: Visit Diagnoses:    ICD-10-CM   1. Lumbar radiculopathy  M54.16 XR C-ARM NO REPORT    Epidural Steroid injection    methylPREDNISolone  acetate (DEPO-MEDROL ) injection 20 mg      Plan: No additional findings.   Meds & Orders:  Meds ordered this encounter  Medications   methylPREDNISolone  acetate (DEPO-MEDROL ) injection 20 mg    Orders Placed This Encounter  Procedures   XR C-ARM NO REPORT   Epidural Steroid injection    Follow-up: Return for visit to requesting provider as needed.   Procedures: No procedures performed  Lumbar Epidural Steroid Injection - Interlaminar Approach with Fluoroscopic Guidance  Patient: [name]      Date of Birth: 09-06-1955 MRN: [id number] PCP: [facility], [facility]      Visit Date: 11/19/2023   Universal Protocol:     Consent Given By: the patient  Position: PRONE  Additional Comments: Vital signs were monitored before and after the procedure. Patient was prepped and draped in the usual sterile fashion. The correct patient, procedure, and site was verified.   Injection Procedure Details:   Procedure diagnoses: Lumbar  radiculopathy [M54.16]   Meds Administered:  Meds ordered this encounter  Medications   methylPREDNISolone  acetate (DEPO-MEDROL ) injection 20 mg     Laterality: Left  Location/Site:  L5-S1  Needle: 3.5 in., 20 ga. Tuohy  Needle Placement: Paramedian epidural  Findings:   -Comments: Excellent flow of contrast into the epidural space.  Procedure Details: Using a paramedian approach from the side mentioned above, the region overlying the inferior lamina was localized under fluoroscopic visualization and the soft tissues overlying this structure were infiltrated with 4 ml. of 1% Lidocaine  without Epinephrine . The Tuohy needle was inserted into the epidural space using a paramedian approach.   The epidural space was localized using loss of resistance along with counter oblique bi-planar fluoroscopic views.  After negative aspirate for air, blood, and CSF, a 2 ml. volume of Isovue-250 was injected into the epidural space and the flow of contrast was observed. Radiographs were obtained for documentation purposes.    The injectate was administered into the level noted above.   Additional Comments:  The patient tolerated the procedure well Dressing: 2 x 2 sterile gauze and Band-Aid    Post-procedure details: Patient was observed during the procedure. Post-procedure instructions were reviewed.  Patient left the clinic in stable condition.   Clinical History: Narrative & Impression CLINICAL DATA:  Low back pain   EXAM: MRI LUMBAR SPINE WITHOUT CONTRAST   TECHNIQUE: Multiplanar, multisequence MR imaging of the lumbar spine was performed. No intravenous contrast was administered.   COMPARISON:  MRI of the lumbar spine dated 12/25/2018   FINDINGS:  Segmentation: Standard.   Alignment:  Physiologic lumbar alignment is maintained.   Vertebrae: Vertebral bodies demonstrate normal signal intensity. No compression fractures.   Conus medullaris and cauda equina: The conus  medullaris terminates at the level of L1-L2. The distal spinal cord signal intensity is normal. Congenital narrowing of the canal.   Paraspinal and other soft tissues: The visualized abdomen and pelvis show no soft tissue abnormality. The visualized aorta is normal.   Disc levels:   L1-L2: Disc is normal in configuration. Moderate bilateral facet arthropathy. No neuroforaminal stenosis. No spinal canal stenosis.   L2-L3: Disc is normal in configuration. Moderate bilateral facet arthropathy. No neuroforaminal stenosis. No spinal canal stenosis.   L3-L4: Disc is normal in configuration. Moderate bilateral facet arthropathy. No neuroforaminal stenosis. No spinal canal stenosis.   L4-L5: Disc bulge. Severe bilateral facet arthropathy. Mild bilateral neuroforaminal stenosis. Moderate to severe spinal canal stenosis.   L5-S1: Disc bulge. Moderate bilateral facet arthropathy. Moderate bilateral neuroforaminal stenosis. Severe spinal canal stenosis.   IMPRESSION: 1. Moderate to severe canal stenosis at L4-L5 secondary to disc bulging and facet arthropathy superimposed on congenital narrowing of the canal. 2. Severe canal stenosis at L5-S1 secondary to disc bulging and facet arthropathy. Moderate foraminal stenoses bilaterally at this level.     Electronically Signed   By: Clem Savory M.D.   On: 10/08/2023 14:27     Objective:  VS:  HT:    WT:   BMI:     BP:(!) 170/88  HR:84bpm  TEMP: ( )  RESP:  Physical Exam Vitals and nursing note reviewed.  Constitutional:      General: He is not in acute distress.    Appearance: Normal appearance. He is not ill-appearing.  HENT:     Head: Normocephalic and atraumatic.     Right Ear: External ear normal.     Left Ear: External ear normal.     Nose: No congestion.  Eyes:     Extraocular Movements: Extraocular movements intact.  Cardiovascular:     Rate and Rhythm: Normal rate.     Pulses: Normal pulses.  Pulmonary:      Effort: Pulmonary effort is normal. No respiratory distress.  Abdominal:     General: There is no distension.     Palpations: Abdomen is soft.  Musculoskeletal:        General: No tenderness or signs of injury.     Cervical back: Neck supple.     Right lower leg: No edema.     Left lower leg: No edema.     Comments: Patient has good distal strength without clonus.  Skin:    Findings: No erythema or rash.  Neurological:     General: No focal deficit present.     Mental Status: He is alert and oriented to person, place, and time.     Sensory: No sensory deficit.     Motor: No weakness or abnormal muscle tone.     Coordination: Coordination normal.  Psychiatric:        Mood and Affect: Mood normal.        Behavior: Behavior normal.      Imaging: No results found.

## 2023-11-19 NOTE — Procedures (Signed)
 Lumbar Epidural Steroid Injection - Interlaminar Approach with Fluoroscopic Guidance  Patient: [name]      Date of Birth: 06-Jun-1955 MRN: [id number] PCP: [facility], [facility]      Visit Date: 11/19/2023   Universal Protocol:     Consent Given By: the patient  Position: PRONE  Additional Comments: Vital signs were monitored before and after the procedure. Patient was prepped and draped in the usual sterile fashion. The correct patient, procedure, and site was verified.   Injection Procedure Details:   Procedure diagnoses: Lumbar radiculopathy [M54.16]   Meds Administered:  Meds ordered this encounter  Medications   methylPREDNISolone  acetate (DEPO-MEDROL ) injection 20 mg     Laterality: Left  Location/Site:  L5-S1  Needle: 3.5 in., 20 ga. Tuohy  Needle Placement: Paramedian epidural  Findings:   -Comments: Excellent flow of contrast into the epidural space.  Procedure Details: Using a paramedian approach from the side mentioned above, the region overlying the inferior lamina was localized under fluoroscopic visualization and the soft tissues overlying this structure were infiltrated with 4 ml. of 1% Lidocaine  without Epinephrine . The Tuohy needle was inserted into the epidural space using a paramedian approach.   The epidural space was localized using loss of resistance along with counter oblique bi-planar fluoroscopic views.  After negative aspirate for air, blood, and CSF, a 2 ml. volume of Isovue-250 was injected into the epidural space and the flow of contrast was observed. Radiographs were obtained for documentation purposes.    The injectate was administered into the level noted above.   Additional Comments:  The patient tolerated the procedure well Dressing: 2 x 2 sterile gauze and Band-Aid    Post-procedure details: Patient was observed during the procedure. Post-procedure instructions were reviewed.  Patient left the clinic in stable  condition.

## 2023-12-02 ENCOUNTER — Ambulatory Visit: Admitting: Pulmonary Disease

## 2023-12-02 VITALS — BP 128/83 | HR 93 | Ht 74.0 in | Wt 222.8 lb

## 2023-12-02 DIAGNOSIS — Z87891 Personal history of nicotine dependence: Secondary | ICD-10-CM

## 2023-12-02 DIAGNOSIS — R918 Other nonspecific abnormal finding of lung field: Secondary | ICD-10-CM

## 2023-12-02 NOTE — Progress Notes (Signed)
 [name]    [id number]    [birth year]  Primary Care Physician:[facility], [facility]  Referring Physician: [facility], [facility] [address] [place],  [place] [address]  Chief complaint:   In for evaluation, recent hospital follow-up  HPI:  Recently hospitalized for fevers cough headaches, abnormal CT scan showing right upper lobe consolidative process  Had a bronchoscopy that was negative  Came in today following a repeat CT scan of the chest which showed resolution of the infiltrative process  Has no significant symptoms at present  Has a 30-pack-year smoking history committed to staying off cigarettes  No pertinent occupational history  [name] believes [name] got sick following exposure to some mold   Outpatient Encounter Medications as of 12/02/2023  Medication Sig   ACCU-CHEK GUIDE TEST test strip SMARTSIG:Strip(s)   atorvastatin  (LIPITOR) 10 MG tablet Take 1 tablet (10 mg total) by mouth daily.   Blood Glucose Monitoring Suppl (BLOOD GLUCOSE MONITOR SYSTEM) w/Device KIT Use to check blood sugar three times daily   CINNAMON PO Take 3 capsules by mouth 2 (two) times daily.   gabapentin (NEURONTIN) 300 MG capsule Take 900 mg by mouth at bedtime.   GARLIC PO Take 2 capsules by mouth daily.   Glucose Blood (BLOOD GLUCOSE TEST STRIPS) STRP Use to check blood sugar three times daily as directed   hydrochlorothiazide  (HYDRODIURIL ) 25 MG tablet Take 25 mg by mouth every other day.   HYDROcodone -acetaminophen  (NORCO) 5-325 MG tablet Take 1 tablet by mouth every 6 (six) hours as needed for moderate pain.   hydrOXYzine (ATARAX) 25 MG tablet Take 25 mg by mouth at bedtime.   insulin  aspart (NOVOLOG ) 100 UNIT/ML FlexPen Inject 4 Units into the skin 3 (three) times daily with meals. Only take if eating a meal AND Blood Glucose (BG) is 80 or higher.   insulin  glargine (LANTUS  SOLOSTAR) 100 UNIT/ML Solostar Pen Inject 15 Units into the skin at bedtime.   insulin  glargine (LANTUS )  100 UNIT/ML Solostar Pen Inject 15 Units into the skin daily.   Insulin  Pen Needle (PEN NEEDLES) 31G X 5 MM MISC Use to inject insulin  3 times daily   Lancet Device MISC 1 each by Does not apply route 3 (three) times daily. May dispense any manufacturer covered by patient's insurance.   Lancets MISC Use to inject insulin  three times daily   lisinopril  (ZESTRIL ) 40 MG tablet Take 40 mg by mouth daily.   metFORMIN  (GLUCOPHAGE ) 1000 MG tablet Take 1 tablet (1,000 mg total) by mouth 2 (two) times daily with a meal.   methocarbamol  (ROBAXIN ) 500 MG tablet Take 1 tablet (500 mg total) by mouth 2 (two) times daily.   MICROLET LANCETS MISC Pt uses microlet lancets for his microlet next lancet device.   oxyCODONE -acetaminophen  (PERCOCET) 10-325 MG tablet Take 1 tablet by mouth 4 (four) times daily as needed.   pantoprazole (PROTONIX) 20 MG tablet Take 20 mg by mouth at bedtime.   No facility-administered encounter medications on file as of 12/02/2023.    Allergies as of 12/02/2023 - Review Complete 12/02/2023  Allergen Reaction Noted   Iodinated contrast media Itching and Other (See Comments) 05/23/2019    Past Medical History:  Diagnosis Date   Arthritis    Blood transfusion without reported diagnosis    30 yrs ago   Chronic low back pain    Diabetes (HCC)    Hypertension    Insomnia  Muscle cramps    Skin fissure    Uncontrolled type 2 diabetes mellitus without complication, without long-term current use of insulin  01/24/2015    Past Surgical History:  Procedure Laterality Date   CHOLECYSTECTOMY     EYE MUSCLE SURGERY     bilateral   FINGER SURGERY     right hand   TOE AMPUTATION     left   VIDEO BRONCHOSCOPY WITH ENDOBRONCHIAL NAVIGATION Right 08/31/2023   Procedure: VIDEO BRONCHOSCOPY WITH ENDOBRONCHIAL NAVIGATION;  Surgeon: Shelah Lamar RAMAN, MD;  Location: [facility] ENDOSCOPY;  Service: Pulmonary;  Laterality: Right;    Family History  Problem Relation Age of Onset   Seizures Father  63       broken hip   Diabetes Sister    Hypertension Sister    Diabetes Sister    Diabetes Sister    Colon cancer Neg Hx     Social History   Socioeconomic History   Marital status: Married    Spouse name: Not on file   Number of children: 3   Years of education: Not on file   Highest education level: Not on file  Occupational History   Occupation: truck Sports administrator: [employer]  Tobacco Use   Smoking status: Former    Current packs/day: 1.00    Average packs/day: 1 pack/day for 30.0 years (30.0 ttl pk-yrs)    Types: Cigarettes   Smokeless tobacco: Never  Substance and Sexual Activity   Alcohol use: Yes    Comment: 2 24oz cans of beer twice a day   Drug use: No   Sexual activity: Not on file  Other Topics Concern   Not on file  Social History Narrative   Not on file   Social Drivers of Health   Financial Resource Strain: Medium Risk (04/21/2022)   Received from [facility]   Overall Financial Resource Strain (CARDIA)    Difficulty of Paying Living Expenses: Somewhat hard  Food Insecurity: No Food Insecurity (08/28/2023)   Hunger Vital Sign    Worried About Running Out of Food in the Last Year: Never true    Ran Out of Food in the Last Year: Never true  Transportation Needs: No Transportation Needs (08/28/2023)   PRAPARE - Administrator, Civil Service (Medical): No    Lack of Transportation (Non-Medical): No  Physical Activity: Insufficiently Active (04/21/2022)   Received from [facility]   Exercise Vital Sign    On average, how many days per week do you engage in moderate to strenuous exercise (like a brisk walk)?: 7 days    On average, how many minutes do you engage in exercise at this level?: 10 min  Stress: No Stress Concern Present (02/13/2023)   Received from [facility] of Occupational Health - Occupational Stress Questionnaire    Feeling of Stress : Only a  little  Social Connections: Socially Integrated (08/28/2023)   Social Connection and Isolation Panel    Frequency of Communication with Friends and Family: More than three times a week    Frequency of Social Gatherings with Friends and Family: Three times a week    Attends Religious Services: 1 to 4 times per year    Active Member of Clubs or Organizations: No    Attends Banker Meetings: 1 to 4 times per year    Marital Status: Married  Catering manager Violence: Not At Risk (08/28/2023)  Humiliation, Afraid, Rape, and Kick questionnaire    Fear of Current or Ex-Partner: No    Emotionally Abused: No    Physically Abused: No    Sexually Abused: No    Review of Systems  Respiratory:  Negative for cough and shortness of breath.     Vitals:   12/02/23 1448  BP: 128/83  Pulse: 93  SpO2: 98%     Physical Exam Constitutional:      Appearance: Normal appearance.  HENT:     Head: Normocephalic.     Nose: Nose normal.     Mouth/Throat:     Mouth: Mucous membranes are moist.  Eyes:     General: No scleral icterus. Cardiovascular:     Rate and Rhythm: Normal rate and regular rhythm.     Heart sounds: No murmur heard.    No friction rub.  Pulmonary:     Effort: No respiratory distress.     Breath sounds: No stridor. No wheezing or rhonchi.  Musculoskeletal:     Cervical back: No rigidity.  Neurological:     Mental Status: [name] is alert.  Psychiatric:        Mood and Affect: Mood normal.      Data Reviewed: CT scan was reviewed and compared with previous CT scan findings  Bronchoscopy testing results also reviewed-no organism   Assessment:  Multilobar pneumonia - Completely resolved  Abnormal CT scan of the chest - Completely resolved  History of diabetes, hypertension  Past smoking history   Plan/Recommendations: With complete resolution of the CT scan findings, I do not believe we need to do anything different  Encouraged to continue staying  away from cigarettes  Encouraged to call us  with significant concerns  I will see him as needed  Will not make any follow-up appointment at present  Jennet Epley MD [facility] 12/02/2023, 2:51 PM  CC: Health, [address]

## 2023-12-02 NOTE — Patient Instructions (Addendum)
 Your CT scan did clear up  No other concerning findings on your CT scan Testing from the washings in the lung when you were in the hospital did not reveal any specific organism  I do not believe we need to do any further testing  Will be glad to see you as needed  We do not need to make any follow-up appointment at the present time

## 2024-01-25 ENCOUNTER — Encounter: Payer: Self-pay | Admitting: Radiology

## 2024-03-10 ENCOUNTER — Other Ambulatory Visit (HOSPITAL_COMMUNITY): Payer: Self-pay

## 2024-03-10 MED ORDER — LANTUS SOLOSTAR 100 UNIT/ML ~~LOC~~ SOPN
15.0000 [IU] | PEN_INJECTOR | Freq: Every day | SUBCUTANEOUS | 3 refills | Status: AC
  Filled 2024-03-10: qty 15, 100d supply, fill #0

## 2024-04-05 ENCOUNTER — Other Ambulatory Visit: Payer: Self-pay

## 2024-04-05 ENCOUNTER — Emergency Department (HOSPITAL_BASED_OUTPATIENT_CLINIC_OR_DEPARTMENT_OTHER): Admitting: Radiology

## 2024-04-05 ENCOUNTER — Emergency Department (HOSPITAL_BASED_OUTPATIENT_CLINIC_OR_DEPARTMENT_OTHER)
Admission: EM | Admit: 2024-04-05 | Discharge: 2024-04-05 | Disposition: A | Discharge status: Home / Self Care | Attending: Emergency Medicine | Admitting: Emergency Medicine

## 2024-04-05 ENCOUNTER — Encounter (HOSPITAL_BASED_OUTPATIENT_CLINIC_OR_DEPARTMENT_OTHER): Payer: Self-pay | Admitting: Emergency Medicine

## 2024-04-05 DIAGNOSIS — Z794 Long term (current) use of insulin: Secondary | ICD-10-CM | POA: Diagnosis not present

## 2024-04-05 DIAGNOSIS — Z7984 Long term (current) use of oral hypoglycemic drugs: Secondary | ICD-10-CM | POA: Insufficient documentation

## 2024-04-05 DIAGNOSIS — L97521 Non-pressure chronic ulcer of other part of left foot limited to breakdown of skin: Secondary | ICD-10-CM | POA: Insufficient documentation

## 2024-04-05 DIAGNOSIS — Z79899 Other long term (current) drug therapy: Secondary | ICD-10-CM | POA: Insufficient documentation

## 2024-04-05 DIAGNOSIS — M79672 Pain in left foot: Secondary | ICD-10-CM | POA: Diagnosis present

## 2024-04-05 DIAGNOSIS — E11621 Type 2 diabetes mellitus with foot ulcer: Secondary | ICD-10-CM | POA: Insufficient documentation

## 2024-04-05 DIAGNOSIS — I10 Essential (primary) hypertension: Secondary | ICD-10-CM | POA: Insufficient documentation

## 2024-04-05 LAB — COMPREHENSIVE METABOLIC PANEL WITH GFR
ALT: 23 U/L (ref 0–44)
AST: 20 U/L (ref 15–41)
Albumin: 4.4 g/dL (ref 3.5–5.0)
Alkaline Phosphatase: 77 U/L (ref 38–126)
Anion gap: 13 (ref 5–15)
BUN: 13 mg/dL (ref 8–23)
CO2: 26 mmol/L (ref 22–32)
Calcium: 10.1 mg/dL (ref 8.9–10.3)
Chloride: 97 mmol/L — ABNORMAL LOW (ref 98–111)
Creatinine, Ser: 1.06 mg/dL (ref 0.61–1.24)
GFR, Estimated: >60 mL/min
Glucose, Bld: 193 mg/dL — ABNORMAL HIGH (ref 70–99)
Potassium: 4.8 mmol/L (ref 3.5–5.1)
Sodium: 136 mmol/L (ref 135–145)
Total Bilirubin: 0.5 mg/dL (ref 0.0–1.2)
Total Protein: 8 g/dL (ref 6.5–8.1)

## 2024-04-05 LAB — CBC WITH DIFFERENTIAL/PLATELET
Abs Immature Granulocytes: 0.02 K/uL (ref 0.00–0.07)
Basophils Absolute: 0 K/uL (ref 0.0–0.1)
Basophils Relative: 1 %
Eosinophils Absolute: 0.2 K/uL (ref 0.0–0.5)
Eosinophils Relative: 3 %
HCT: 42.9 % (ref 39.0–52.0)
Hemoglobin: 14.5 g/dL (ref 13.0–17.0)
Immature Granulocytes: 0 %
Lymphocytes Relative: 43 %
Lymphs Abs: 2.5 K/uL (ref 0.7–4.0)
MCH: 30 pg (ref 26.0–34.0)
MCHC: 33.8 g/dL (ref 30.0–36.0)
MCV: 88.6 fL (ref 80.0–100.0)
Monocytes Absolute: 0.5 K/uL (ref 0.1–1.0)
Monocytes Relative: 9 %
Neutro Abs: 2.5 K/uL (ref 1.7–7.7)
Neutrophils Relative %: 44 %
Platelets: 217 K/uL (ref 150–400)
RBC: 4.84 MIL/uL (ref 4.22–5.81)
RDW: 13.1 % (ref 11.5–15.5)
WBC: 5.7 K/uL (ref 4.0–10.5)
nRBC: 0 % (ref 0.0–0.2)

## 2024-04-05 MED ORDER — DOXYCYCLINE HYCLATE 100 MG PO CAPS: 100.0000 mg | ORAL_CAPSULE | Freq: Two times a day (BID) | ORAL | 0 refills | Status: AC

## 2024-04-05 NOTE — Discharge Instructions (Signed)
 You were seen in the ER today for evaluation of your foot pain. I think you likely have an infection. I am prescribing you an antibiotic to take as prescribed. Please make sure you follow up with a podiatrist (foot doctor) as this will need further treatment, care, and re-evaluation. Please make sure that you are keeping the foot clean and dry. I recommend cleaning with Dial soap and water. I have attached some additional information for you to review. If you have any concerns, new or worsening symptoms, please return to the nearest ER for re-evaluation.   a health care provider if: You received a tetanus shot and you have swelling, severe pain, redness, or bleeding at the injection site. Your pain is not controlled with medicine. You have any of these signs of infection: More redness, swelling, or pain around the wound. Fluid or blood coming from the wound. Warmth coming from the wound. A fever or chills. You are nauseous or you vomit. You are dizzy. You have a new rash or hardness around the wound. Get help right away if: You have a red streak of skin near the area around your wound. Pus or a bad smell coming from the wound. Your wound has been closed with staples, sutures, skin glue, or adhesive strips and it begins to open up and separate. Your wound is bleeding, and the bleeding does not stop with gentle pressure. These symptoms may represent a serious problem that is an emergency. Do not wait to see if the symptoms will go away. Get medical help right away. Call your local emergency services (911 in the [place] Do not drive yourself to the hospital.

## 2024-04-05 NOTE — ED Triage Notes (Signed)
 C/o left foot pain and swelling. States injured foot 46 years ago but pain came back

## 2024-04-05 NOTE — ED Provider Notes (Signed)
 " [place] EMERGENCY DEPARTMENT AT [facility] Provider Note   CSN: [id number] Arrival date & time: 04/05/24  1624     Patient presents with: Foot Pain   [name] is a 69 y.o. male with h/o HTN and DM presents to the emerged from today for ration of foot pain/wound.  Patient reported on Saturday, few days prior that he started having some pain to the area and noticed some clear discharge starting yesterday.  Denies any recent injury to the area.  Had surgery on open 40 years prior from an accident.  Reports that sugars have been maximum 200 and felt like they have been improving control.  Denies any fever or swelling.  Has been cleaning the area with hydroperoxide and alcohol and keeping a moist cotton ball between his toe.  Denies any allergies to any medications.   Foot Pain       Prior to Admission medications  Medication Sig Start Date End Date Taking? Authorizing Provider  ACCU-CHEK GUIDE TEST test strip SMARTSIG:Strip(s)    [provider]  atorvastatin  (LIPITOR) 10 MG tablet Take 1 tablet (10 mg total) by mouth daily. 12/18/18   Saguier, [place], PA-C  Blood Glucose Monitoring Suppl (BLOOD GLUCOSE MONITOR SYSTEM) w/Device KIT Use to check blood sugar three times daily 09/02/23   Rojelio Nest, DO  CINNAMON PO Take 3 capsules by mouth 2 (two) times daily.    [provider]  gabapentin (NEURONTIN) 300 MG capsule Take 900 mg by mouth at bedtime.    [provider]  GARLIC PO Take 2 capsules by mouth daily.    [provider]  Glucose Blood (BLOOD GLUCOSE TEST STRIPS) STRP Use to check blood sugar three times daily as directed 09/02/23   Rojelio Nest, DO  hydrochlorothiazide  (HYDRODIURIL ) 25 MG tablet Take 25 mg by mouth every other day. 07/08/23   [provider]  HYDROcodone -acetaminophen  (NORCO) 5-325 MG tablet Take 1 tablet by mouth every 6 (six) hours as needed for moderate pain. 06/15/19   Saguier, [place], PA-C   hydrOXYzine (ATARAX) 25 MG tablet Take 25 mg by mouth at bedtime. 08/04/23   [provider]  insulin  aspart (NOVOLOG ) 100 UNIT/ML FlexPen Inject 4 Units into the skin 3 (three) times daily with meals. Only take if eating a meal AND Blood Glucose (BG) is 80 or higher. 09/02/23   Rojelio Nest, DO  insulin  glargine (LANTUS  SOLOSTAR) 100 UNIT/ML Solostar Pen Inject 15 Units into the skin at bedtime. 11/10/23     insulin  glargine (LANTUS  SOLOSTAR) 100 UNIT/ML Solostar Pen Inject 15 units into the skin at bedtime. 03/10/24     insulin  glargine (LANTUS ) 100 UNIT/ML Solostar Pen Inject 15 Units into the skin daily. 09/02/23   Rojelio Nest, DO  Insulin  Pen Needle (PEN NEEDLES) 31G X 5 MM MISC Use to inject insulin  3 times daily 11/12/23     Lancet Device MISC 1 each by Does not apply route 3 (three) times daily. May dispense any manufacturer covered by patient's insurance. 09/02/23   Rojelio Nest, DO  Lancets MISC Use to inject insulin  three times daily 09/02/23   Rojelio Nest, DO  lisinopril  (ZESTRIL ) 40 MG tablet Take 40 mg by mouth daily.    [provider]  metFORMIN  (GLUCOPHAGE ) 1000 MG tablet Take 1 tablet (1,000 mg total) by mouth 2 (two) times daily with a meal. 12/31/18   Saguier, [place], PA-C  methocarbamol  (ROBAXIN ) 500 MG tablet Take 1 tablet (500 mg total) by mouth 2 (  two) times daily. 10/22/23   Smoot, Lauraine LABOR, PA-C  MICROLET LANCETS MISC Pt uses microlet lancets for his microlet next lancet device. 06/18/15   Henson, Vickie L, NP-C  oxyCODONE -acetaminophen  (PERCOCET) 10-325 MG tablet Take 1 tablet by mouth 4 (four) times daily as needed. 09/03/23   [provider]  pantoprazole (PROTONIX) 20 MG tablet Take 20 mg by mouth at bedtime. 06/08/23   [provider]    Allergies: Iodinated contrast media    Review of Systems  Constitutional:  Negative for chills and fever.  Cardiovascular:  Negative for leg swelling.  Musculoskeletal:  Positive for arthralgias.   Skin:  Positive for wound.    Updated Vital Signs BP (!) 163/94 (BP Location: Right Arm)   Pulse 93   Temp 98 F (36.7 C)   Resp 18   SpO2 98%   Physical Exam Vitals and nursing note reviewed.  Constitutional:      General: He is not in acute distress.    Appearance: He is not ill-appearing or toxic-appearing.  Eyes:     General: No scleral icterus. Cardiovascular:     Pulses:          Dorsalis pedis pulses are 2+ on the left side.       Posterior tibial pulses are 2+ on the left side.  Pulmonary:     Effort: Pulmonary effort is normal. No respiratory distress.  Feet:     Comments: Surgical deformity noted.  Patient has a deep fissure, appears chronic at the plantar aspect of the foot.  No discharge noted from this area.  No swelling, bogginess, induration or fluctuance.  Patient does have some macerated skin present between the great toe and adjacent toe.  Does not appear actively deep.  No significant swelling into the dorsum or plantar aspect of the foot.  Easily palpable pulses.  Compartments are soft.  No red streaking noted.  Sensation reportedly intact per patient Skin:    General: Skin is warm and dry.  Neurological:     Mental Status: He is alert.     (all labs ordered are listed, but only abnormal results are displayed) Labs Reviewed  COMPREHENSIVE METABOLIC PANEL WITH GFR - Abnormal; Notable for the following components:      Result Value   Chloride 97 (*)    Glucose, Bld 193 (*)    All other components within normal limits  CBC WITH DIFFERENTIAL/PLATELET    EKG: None  Radiology: DG Foot Complete Left Result Date: 04/05/2024 EXAM: 3 OR MORE VIEW(S) XRAY OF THE LEFT FOOT 04/05/2024 04:49:00 PM COMPARISON: None available. CLINICAL HISTORY: The patient reports pain. FINDINGS: BONES AND JOINTS: Status post amputation of the second and third digits. Severe hallux valgus deformity. Remote healed fifth metatarsal fracture. Severe degenerative changes of the first  metatarsophalangeal joint with joint space narrowing, sclerosis and osteophyte formation. Osteopenia. No acute spurring of the midfoot. No acute fracture. SOFT TISSUES: Unremarkable. No radiopaque foreign body. IMPRESSION: 1. No acute fracture or dislocation. 2. Severe hallux valgus deformity with severe osteoarthritis of the first metatarsophalangeal joint. Electronically signed by: Rogelia Myers MD 04/05/2024 04:56 PM EST RP Workstation: [id number]   Procedures   Medications Ordered in the ED - No data to display    Medical Decision Making Amount and/or Complexity of Data Reviewed Labs: ordered. Radiology: ordered.  Risk Prescription drug management.   69 y.o. male presents to the ER for evaluation of foot pain/wound. Differential diagnosis includes but is not limited to  diabetic wound, ulceration, cellulitis, osteomyelitis. Vital signs mildly elevated BP otherwise unremarkable. Physical exam as noted above.   I independently reviewed and interpreted the patient's labs.  CBC without leukocytosis or anemia.  CMP shows glucose of 133, chloride 97, otherwise no electrolyte or LFT abnormality.  Xr imaging shows markable. No radiopaque foreign body. IMPRESSION: 1. No acute fracture or dislocation. 2. Severe hallux valgus deformity with severe osteoarthritis of the first metatarsophalangeal joint. Per radiologist's interpretation.    My attending assessed the patient at bedside.  Doubt any osteomyelitis given presentation and appearance of the wound.  Does not this is reasonable treat with doxycycline  and have outpatient follow-up with podiatry.  Patient does not have any systemic signs of infection.  X-rays not show any signs of osteomyelitis.  He has no leukocytosis or fever.  We discussed keeping the area dry but still cleaning it daily.  We discussed return precautions as well with him and family member at bedside.  We discussed the results of the labs/imaging. The plan is take medications,  follow up with podiatry, strict return precautions. We discussed strict return precautions and red flag symptoms. The patient verbalized their understanding and agrees to the plan. The patient is stable and being discharged home in good condition.  I discussed this case with my attending physician who cosigned this note including patient's presenting symptoms, physical exam, and planned diagnostics and interventions. Attending physician stated agreement with plan or made changes to plan which were implemented.   Attending physician assessed patient at bedside.  Portions of this report may have been transcribed using voice recognition software. Every effort was made to ensure accuracy; however, inadvertent computerized transcription errors may be present.    Final diagnoses:  Diabetic ulcer of other part of left foot associated with type 2 diabetes mellitus, limited to breakdown of skin [facility])    ED Discharge Orders          Ordered    doxycycline  (VIBRAMYCIN ) 100 MG capsule  2 times daily        04/05/24 1938               Bernis Ernst, [place] 04/09/24 1438  "

## 2024-04-07 ENCOUNTER — Ambulatory Visit: Admitting: Podiatry

## 2024-04-07 DIAGNOSIS — L97522 Non-pressure chronic ulcer of other part of left foot with fat layer exposed: Secondary | ICD-10-CM

## 2024-04-07 DIAGNOSIS — E1149 Type 2 diabetes mellitus with other diabetic neurological complication: Secondary | ICD-10-CM

## 2024-04-07 MED ORDER — SANTYL 250 UNIT/GM EX OINT
1.0000 | TOPICAL_OINTMENT | Freq: Every day | CUTANEOUS | 1 refills | Status: AC
  Filled 2024-04-09: qty 30, 30d supply, fill #0

## 2024-04-07 NOTE — Patient Instructions (Signed)
 Collagenase  Ointment What is this medication? COLLAGENASE  (kohl LAH jen ace) treats skin ulcers and severe burns. It works by promoting healing and new skin growth. This medicine may be used for other purposes; ask your health care provider or pharmacist if you have questions. COMMON BRAND NAME(S): Santyl  What should I tell my care team before I take this medication? They need to know if you have any of these conditions: An unusual or allergic reaction to collagenase , other medications, foods, dyes, or preservatives Pregnant or trying to get pregnant Breast-feeding How should I use this medication? This medication is for external use only. Do not take by mouth. Wash the wound as directed by your care team. Do not scrub. If you are applying a topical antibiotic to the wound, apply the antibiotic before this medication. Do not touch the tip of the ointment tube with any surface, especially your fingers or the wound. Try to only get the ointment on the wound itself. If some gets on normal skin, wipe away with a sterile gauze pad. Do not use your medication more often than directed. Talk to your care team about the use of this medication in children. Special care may be needed. Overdosage: If you think you have taken too much of this medicine contact a poison control center or emergency room at once. NOTE: This medicine is only for you. Do not share this medicine with others. What if I miss a dose? If you miss a dose, use it as soon as you can. If it is almost time for your next dose, use only that dose. Do not use double or extra doses. What may interact with this medication? Do not take this medication with any of the following: Aluminum acetate, Burow's solution Povidone iodine Silver  nitrate Silver  sulfadiazine  This list may not describe all possible interactions. Give your health care provider a list of all the medicines, herbs, non-prescription drugs, or dietary supplements you use. Also  tell them if you smoke, drink alcohol , or use illegal drugs. Some items may interact with your medicine. What should I watch for while using this medication? Visit your care team for regular checks on your progress. If your wound begins to look worse, smell bad, has colored discharge or more discharge, or increases in size, contact your care team. You may have an infection or need a change in your treatment. If you develop a fever, chills, low blood pressure, dizziness, rapid heartbeat, or confusion, contact your care team immediately. You may have an infection of your blood. What side effects may I notice from receiving this medication? Side effects that you should report to your care team as soon as possible: Allergic reactions--skin rash, itching, hives, swelling of the face, lips, tongue, or throat Side effects that usually do not require medical attention (report to your care team if they continue or are bothersome): Mild skin irritation, redness, or dryness This list may not describe all possible side effects. Call your doctor for medical advice about side effects. You may report side effects to FDA at 1-800-FDA-1088. Where should I keep my medication? Keep out of the reach of children and pets. Store below 25 degrees C (77 degrees F). Throw away any unused medication after the expiration date. NOTE: This sheet is a summary. It may not cover all possible information. If you have questions about this medicine, talk to your doctor, pharmacist, or health care provider.  2024 Elsevier/Gold Standard (2021-07-08 00:00:00)

## 2024-04-07 NOTE — Progress Notes (Signed)
 Subjective:   Patient ID: [name], male   DOB: 69 y.o.   MRN: [id number]   HPI Chief Complaint  Patient presents with   Diabetes Mellitus    IDDM Patient with A1C 11.1 of presents to office today for diabetic wound. Patient reports wound has some drainage .   69 year old male presents the office today with concerns of a wound to the bottom of his left foot.  States he had an injury several years ago at work and resulted in amputation of the 2nd and 3rd toes.  He states that recently area did open up.  He was seen in urgent care and prescribed doxycycline  which he states he just picked up today.  He does not report any fevers or chills.   Review of Systems  All other systems reviewed and are negative.  Past Medical History:  Diagnosis Date   Arthritis    Blood transfusion without reported diagnosis    30 yrs ago   Chronic low back pain    Diabetes (HCC)    Hypertension    Insomnia    Muscle cramps    Skin fissure    Uncontrolled type 2 diabetes mellitus without complication, without long-term current use of insulin  01/24/2015    Past Surgical History:  Procedure Laterality Date   CHOLECYSTECTOMY     EYE MUSCLE SURGERY     bilateral   FINGER SURGERY     right hand   TOE AMPUTATION     left   VIDEO BRONCHOSCOPY WITH ENDOBRONCHIAL NAVIGATION Right 08/31/2023   Procedure: VIDEO BRONCHOSCOPY WITH ENDOBRONCHIAL NAVIGATION;  Surgeon: Shelah Lamar RAMAN, MD;  Location: [facility] ENDOSCOPY;  Service: Pulmonary;  Laterality: Right;    Current Medications[1]  Allergies[2]        Objective:  Physical Exam  General: AAO x3, NAD  Dermatological: As pictured below there is a hyperkeratotic lesion in the central skin fissure type wound present.  There is no probing to bone, undermining or tunneling.  There is no surrounding erythema, ascending status post no fluctuation or crepitation.  No malodor.  There is self measures about 2 x 0.2 x 0.6 cm.     Vascular: Dorsalis Pedis  artery and Posterior Tibial artery pedal pulses are 2/4 bilateral with immedate capillary fill time. There is no pain with calf compression, swelling, warmth, erythema.   Neruologic: Decreased  Musculoskeletal: Systemic pain on exam.  Previous second, third toe amputations.  Digital contractures present.       Assessment:   Ulceration left foot     Plan:      -Treatment options discussed including all alternatives, risks, and complications -Etiology of symptoms were discussed - X-rays from the urgent care reviewed as well as the chart. - As sharp interpreted the tissue #312 scalpel remove nonviable, devitalized, soft tissue any complications or bleeding.  There is no bleeding and no blood loss.  I cleaned the skin.  Silvadene  was applied followed by dressing.  I have ordered Santyl  for enzymatic debridement.  If needed if insurance does not cover this we will switch to Silvadene  - Surgical shoe with peg assist dispensed to help offload how facilitate wound healing - Discussed glucose control - If symptoms persist recommend MRI - Monitor for any clinical signs or symptoms of infection and directed to call the office immediately should any occur or go to the ER.  Return in about 10 days (around 04/17/2024) for ulcer.  Repeat x-ray next appointment as well as likely repeat blood  work  [name] DPM       [1]  Current Outpatient Medications:    ACCU-CHEK GUIDE TEST test strip, SMARTSIG:Strip(s), Disp: , Rfl:    atorvastatin  (LIPITOR) 10 MG tablet, Take 1 tablet (10 mg total) by mouth daily., Disp: 90 tablet, Rfl: 3   Blood Glucose Monitoring Suppl (BLOOD GLUCOSE MONITOR SYSTEM) w/Device KIT, Use to check blood sugar three times daily, Disp: 1 kit, Rfl: 0   CINNAMON PO, Take 3 capsules by mouth 2 (two) times daily., Disp: , Rfl:    collagenase  (SANTYL ) 250 UNIT/GM ointment, Apply 1 Application topically daily to ulcer on left foot, Disp: 30 g, Rfl: 1   doxycycline   (VIBRAMYCIN ) 100 MG capsule, Take 1 capsule (100 mg total) by mouth 2 (two) times daily., Disp: 20 capsule, Rfl: 0   gabapentin (NEURONTIN) 300 MG capsule, Take 900 mg by mouth at bedtime., Disp: , Rfl:    GARLIC PO, Take 2 capsules by mouth daily., Disp: , Rfl:    Glucose Blood (BLOOD GLUCOSE TEST STRIPS) STRP, Use to check blood sugar three times daily as directed, Disp: 100 strip, Rfl: 0   hydrochlorothiazide  (HYDRODIURIL ) 25 MG tablet, Take 25 mg by mouth every other day., Disp: , Rfl:    HYDROcodone -acetaminophen  (NORCO) 5-325 MG tablet, Take 1 tablet by mouth every 6 (six) hours as needed for moderate pain., Disp: 30 tablet, Rfl: 0   hydrOXYzine (ATARAX) 25 MG tablet, Take 25 mg by mouth at bedtime., Disp: , Rfl:    insulin  aspart (NOVOLOG ) 100 UNIT/ML FlexPen, Inject 4 Units into the skin 3 (three) times daily with meals. Only take if eating a meal AND Blood Glucose (BG) is 80 or higher., Disp: 15 mL, Rfl: 0   insulin  glargine (LANTUS  SOLOSTAR) 100 UNIT/ML Solostar Pen, Inject 15 Units into the skin at bedtime., Disp: 15 mL, Rfl: 3   insulin  glargine (LANTUS  SOLOSTAR) 100 UNIT/ML Solostar Pen, Inject 15 units into the skin at bedtime., Disp: 100 mL, Rfl: 3   insulin  glargine (LANTUS ) 100 UNIT/ML Solostar Pen, Inject 15 Units into the skin daily., Disp: 15 mL, Rfl: 0   Insulin  Pen Needle (PEN NEEDLES) 31G X 5 MM MISC, Use to inject insulin  3 times daily, Disp: 100 each, Rfl: 0   Lancet Device MISC, 1 each by Does not apply route 3 (three) times daily. May dispense any manufacturer covered by patient's insurance., Disp: 1 each, Rfl: 0   Lancets MISC, Use to inject insulin  three times daily, Disp: 100 each, Rfl: 0   lisinopril  (ZESTRIL ) 40 MG tablet, Take 40 mg by mouth daily., Disp: , Rfl:    metFORMIN  (GLUCOPHAGE ) 1000 MG tablet, Take 1 tablet (1,000 mg total) by mouth 2 (two) times daily with a meal., Disp: 180 tablet, Rfl: 3   methocarbamol  (ROBAXIN ) 500 MG tablet, Take 1 tablet (500 mg  total) by mouth 2 (two) times daily., Disp: 20 tablet, Rfl: 0   MICROLET LANCETS MISC, Pt uses microlet lancets for his microlet next lancet device., Disp: 50 each, Rfl: 0   oxyCODONE -acetaminophen  (PERCOCET) 10-325 MG tablet, Take 1 tablet by mouth 4 (four) times daily as needed., Disp: , Rfl:    pantoprazole (PROTONIX) 20 MG tablet, Take 20 mg by mouth at bedtime., Disp: , Rfl:    Insulin  Pen Needle (BD PEN NEEDLE MINI ULTRAFINE) 31G X 5 MM MISC, Use to inject insulin  daily at bedtime, Disp: 100 each, Rfl: 3 [2]  Allergies Allergen Reactions   Iodinated Contrast Media  Itching and Other (See Comments)    Pruritis and a single hive following first time administration of IV contrast.  Benadryl  administered with improvement of symptoms.

## 2024-04-08 ENCOUNTER — Other Ambulatory Visit (HOSPITAL_BASED_OUTPATIENT_CLINIC_OR_DEPARTMENT_OTHER): Payer: Self-pay

## 2024-04-08 ENCOUNTER — Other Ambulatory Visit (HOSPITAL_COMMUNITY): Payer: Self-pay

## 2024-04-08 ENCOUNTER — Other Ambulatory Visit: Payer: Self-pay

## 2024-04-08 MED ORDER — BD PEN NEEDLE MINI ULTRAFINE 31G X 5 MM MISC
3 refills | Status: AC
  Filled 2024-04-08: qty 100, 100d supply, fill #0

## 2024-04-09 ENCOUNTER — Other Ambulatory Visit (HOSPITAL_COMMUNITY): Payer: Self-pay

## 2024-04-10 ENCOUNTER — Other Ambulatory Visit (HOSPITAL_COMMUNITY): Payer: Self-pay

## 2024-04-11 ENCOUNTER — Other Ambulatory Visit (HOSPITAL_COMMUNITY): Payer: Self-pay

## 2024-04-11 ENCOUNTER — Telehealth (HOSPITAL_COMMUNITY): Payer: Self-pay

## 2024-04-11 ENCOUNTER — Other Ambulatory Visit: Payer: Self-pay

## 2024-04-12 ENCOUNTER — Other Ambulatory Visit (HOSPITAL_COMMUNITY): Payer: Self-pay

## 2024-04-12 ENCOUNTER — Telehealth (HOSPITAL_COMMUNITY): Payer: Self-pay

## 2024-04-12 ENCOUNTER — Other Ambulatory Visit: Payer: Self-pay

## 2024-04-12 NOTE — Telephone Encounter (Signed)
 Pharmacy Patient Advocate Encounter   Received notification from Pt Calls Messages that prior authorization for Santyl  250UNIT/GM ointment  is required/requested.   Insurance verification completed.   The patient is insured through [place].   Per test claim: PA required; PA submitted to above mentioned insurance via Latent Key/confirmation #/EOC [facility] Status is pending

## 2024-04-12 NOTE — Telephone Encounter (Signed)
 PA request has been Received. New Encounter has been or will be created for follow up. For additional info see Pharmacy Prior Auth telephone encounter from 04/12/24.

## 2024-04-12 NOTE — Telephone Encounter (Signed)
 Pharmacy Patient Advocate Encounter  Received notification from [employer] that Prior Authorization for Santyl  250UNIT/GM ointment  has been APPROVED from 03/24/24 to 03/23/25. Ran test claim, Copay is $313.64. This test claim was processed through [facility]- copay amounts may vary at other pharmacies due to pharmacy/plan contracts, or as the patient moves through the different stages of their insurance plan.   PA #/Case ID/Reference #: [id number]

## 2024-04-14 ENCOUNTER — Other Ambulatory Visit: Payer: Self-pay | Admitting: Podiatry

## 2024-04-14 ENCOUNTER — Telehealth: Payer: Self-pay | Admitting: Podiatry

## 2024-04-14 ENCOUNTER — Other Ambulatory Visit (HOSPITAL_COMMUNITY): Payer: Self-pay

## 2024-04-14 MED ORDER — SILVER SULFADIAZINE 1 % EX CREA
1.0000 | TOPICAL_CREAM | Freq: Every day | CUTANEOUS | 2 refills | Status: AC
  Filled 2024-04-14: qty 50, 30d supply, fill #0

## 2024-04-14 NOTE — Telephone Encounter (Signed)
 The patient's niece, RENATA,  called in, a bit upset, because she went to pick up the topical ointment prescribed by Dr. Gershon but it is too expensive for Mr. Laughter who survives on a fixed income top afford. The medication: collagenase /Santyl  - 250 unit(s) ointment was almost $300 with insurance. Requested if Dr. Gershon or his nurse could call the patient a generic version or if there is a substitute that could be used. Also could all presciptions be sent to the [facility] near [place] because they tend to have cheaper prescription prices.

## 2024-04-14 NOTE — Telephone Encounter (Signed)
 Spoke to niece informed and verified pharmacy.

## 2024-04-16 ENCOUNTER — Other Ambulatory Visit (HOSPITAL_COMMUNITY): Payer: Self-pay

## 2024-04-21 ENCOUNTER — Encounter: Payer: Self-pay | Admitting: Podiatry

## 2024-04-21 ENCOUNTER — Ambulatory Visit (INDEPENDENT_AMBULATORY_CARE_PROVIDER_SITE_OTHER)

## 2024-04-21 ENCOUNTER — Ambulatory Visit: Admitting: Podiatry

## 2024-04-21 DIAGNOSIS — L97522 Non-pressure chronic ulcer of other part of left foot with fat layer exposed: Secondary | ICD-10-CM

## 2024-04-21 DIAGNOSIS — E1149 Type 2 diabetes mellitus with other diabetic neurological complication: Secondary | ICD-10-CM

## 2024-04-23 NOTE — Progress Notes (Signed)
 Subjective:   Patient ID: [name], male   DOB: 69 y.o.   MRN: [id number]   HPI Chief Complaint  Patient presents with   Diabetic Ulcer    It needs to be trimmed.  Saw Dr. Maree - couple of weeks ago;  A1C - 34?     69 year old male presents the office today with concerns of a wound to the bottom of his left foot.  He was not able to get the Santyl  given cost and insurance coverage so he has been using Silvadene .  He states the foot is doing better but needs to be trimmed on the bottom.  He states that prior to this area opening up he did have a gash in the skin on the bottom of the foot from the prior injury.         Objective:  Physical Exam  General: AAO x3, NAD  Dermatological: As pictured below there is a hyperkeratotic lesion in the central skin fissure type wound present.  There is no ulceration identified.  The area which was present previously I do think is likely more from his prior injury, scarring.  There is no probing, undermining or tunneling.  No surrounding erythema, ascending/there is no drainage or pus or signs of infection otherwise.    Vascular: Dorsalis Pedis artery and Posterior Tibial artery pedal pulses are 2/4 bilateral with immedate capillary fill time. There is no pain with calf compression, swelling, warmth, erythema.   Neruologic: Decreased  Musculoskeletal: Systemic pain on exam.  Previous second, third toe amputations.  Digital contractures present.       Assessment:   Ulceration left foot     Plan:      -Treatment options discussed including all alternatives, risks, and complications -Etiology of symptoms were discussed -X-rays were obtained reviewed.  Multiple views obtained.  Previous second, third amputations with chronic changes present.  Significant arthritic changes present at the first MPJ.  No definitive cortical changes that would suggest osteomyelitis and there is no soft tissue emphysema. -Sharp debridement hyperkeratotic  tissue and complications of bleeding today.  Discussed continue with Silvadene  dressing changes daily.  Continue offloading.  Monitor for any clinical signs or symptoms of infection and directed to call the office immediately should any occur or go to the ER.  Return in about 3 weeks (around 05/12/2024).  [name] DPM

## 2024-05-16 ENCOUNTER — Ambulatory Visit: Admitting: Podiatry
